# Patient Record
Sex: Male | Born: 1965
Health system: Southern US, Community
[De-identification: ages and names within clinical notes are randomized; demographics above are authoritative.]

## PROBLEM LIST (undated history)

## (undated) DIAGNOSIS — J349 Unspecified disorder of nose and nasal sinuses: Secondary | ICD-10-CM

## (undated) DIAGNOSIS — C9210 Chronic myeloid leukemia, BCR/ABL-positive, not having achieved remission: Secondary | ICD-10-CM

## (undated) DIAGNOSIS — Z973 Presence of spectacles and contact lenses: Secondary | ICD-10-CM

## (undated) DIAGNOSIS — K219 Gastro-esophageal reflux disease without esophagitis: Secondary | ICD-10-CM

## (undated) DIAGNOSIS — Z9889 Other specified postprocedural states: Secondary | ICD-10-CM

## (undated) DIAGNOSIS — F329 Major depressive disorder, single episode, unspecified: Secondary | ICD-10-CM

## (undated) DIAGNOSIS — F32A Depression, unspecified: Secondary | ICD-10-CM

## (undated) DIAGNOSIS — I201 Angina pectoris with documented spasm: Secondary | ICD-10-CM

## (undated) DIAGNOSIS — E785 Hyperlipidemia, unspecified: Secondary | ICD-10-CM

## (undated) DIAGNOSIS — Z8719 Personal history of other diseases of the digestive system: Secondary | ICD-10-CM

## (undated) DIAGNOSIS — R112 Nausea with vomiting, unspecified: Secondary | ICD-10-CM

## (undated) DIAGNOSIS — M1990 Unspecified osteoarthritis, unspecified site: Secondary | ICD-10-CM

## (undated) DIAGNOSIS — G4733 Obstructive sleep apnea (adult) (pediatric): Secondary | ICD-10-CM

## (undated) DIAGNOSIS — Z789 Other specified health status: Secondary | ICD-10-CM

## (undated) HISTORY — PX: TONSILLECTOMY: SUR1361

## (undated) HISTORY — PX: CARDIAC CATHETERIZATION: SHX172

## (undated) HISTORY — DX: Angina pectoris with documented spasm: I20.1

## (undated) HISTORY — DX: Hyperlipidemia, unspecified: E78.5

## (undated) HISTORY — DX: Obstructive sleep apnea (adult) (pediatric): G47.33

## (undated) HISTORY — PX: COLONOSCOPY: SHX174

---

## 2002-10-28 ENCOUNTER — Ambulatory Visit (HOSPITAL_COMMUNITY): Admission: RE | Admit: 2002-10-28 | Discharge: 2002-10-28 | Payer: Self-pay | Admitting: Cardiology

## 2007-09-04 HISTORY — PX: HAND / FINGER LESION EXCISION: SUR531

## 2007-12-26 ENCOUNTER — Ambulatory Visit (HOSPITAL_BASED_OUTPATIENT_CLINIC_OR_DEPARTMENT_OTHER): Admission: RE | Admit: 2007-12-26 | Discharge: 2007-12-26 | Payer: Self-pay | Admitting: Orthopedic Surgery

## 2008-10-23 ENCOUNTER — Encounter: Admission: RE | Admit: 2008-10-23 | Discharge: 2008-10-23 | Payer: Self-pay | Admitting: Family Medicine

## 2010-08-23 ENCOUNTER — Ambulatory Visit: Payer: Self-pay | Admitting: Sports Medicine

## 2010-08-23 DIAGNOSIS — M2170 Unequal limb length (acquired), unspecified site: Secondary | ICD-10-CM

## 2010-08-23 DIAGNOSIS — M25569 Pain in unspecified knee: Secondary | ICD-10-CM

## 2010-08-23 DIAGNOSIS — Q667 Congenital pes cavus, unspecified foot: Secondary | ICD-10-CM | POA: Insufficient documentation

## 2010-10-05 NOTE — Assessment & Plan Note (Signed)
Summary: NP ORTHOTICS/MJD   Vital Signs:  Patient profile:   45 year old male Height:      74 inches Weight:      209 pounds BMI:     26.93 Pulse rate:   50 / minute BP sitting:   123 / 71  (left arm)  Vitals Entered By: Rochele Pages RN (August 23, 2010 11:15 AM) CC: eval for orthotics   CC:  eval for orthotics.  History of Present Illness: runs with Thad in 2005 first had orthotics made for persistent knee pain and leg length diff now has started running again just did half marathon peaked at about 23 MPW wants to run marathon  orthotics made in 2005 by dr bassett are now broken down and he is starting to get back and knee pain issues again along with calf tightness on RT where he has lift  Preventive Screening-Counseling & Management  Alcohol-Tobacco     Smoking Status: quit  Social History: Smoking Status:  quit  Physical Exam  General:  Well-developed,well-nourished,in no acute distress; alert,appropriate and cooperative throughout examination Msk:  RT leg is 1 cm shorter than left  good ROM of hips and knees no real weaknes sof quads  tight on forward flexion and on knee to chest   feet show mod cavus change left foot though shows some midfoot pronation  running gait is good w excellent form   Impression & Recommendations:  Problem # 1:  TALIPES CAVUS (ICD-754.71)  I think his higer arched foot is giving him more impact which helps trigger both back and knee pain issues  Patient was fitted for a standard, cushioned, semi-rigid orthotic.  The orthotic was heated and the patient stood on the orthotic blank positioned on the orthotic stand. The patient was positioned in subtalar neutral position and 10 degrees of ankle dorsiflexion in a weight bearing stance. After completion of molding a stable based was applied to the orthotic blank.   The blank was ground to a stable position for weight bearing. size 12 blue swirl base large blue EVA posting rt  orthopedic foam half length additional orthotic padding  none  time 40 mins  gait looked good and very comfortable on completion  Orders: Orthotic Materials, each unit (L3002)  Problem # 2:  UNEQUAL LEG LENGTH (ICD-736.81)  only small correction added to RT  not much change on running gait and looks fine after correction made  Orders: Orthotic Materials, each unit (Z6109)  Problem # 3:  KNEE PAIN, BILATERAL (ICD-719.46) follow  this went away w new orthotics in past  see if these take pressure off knees   Orders Added: 1)  Est. Patient Level IV [60454] 2)  Orthotic Materials, each unit [L3002]

## 2011-01-16 NOTE — Op Note (Signed)
NAMEMONTAVIOUS, WIERZBA               ACCOUNT NO.:  [id number]   MEDICAL RECORD NO.:  [id number]          PATIENT TYPE:  AMB   LOCATION:  DSC                          FACILITY:  [facility]   PHYSICIAN:  Cindee Salt, M.D.       DATE OF BIRTH:  25-May-1966   DATE OF PROCEDURE:  12/26/2007  DATE OF DISCHARGE:                               OPERATIVE REPORT   PREOPERATIVE DIAGNOSIS:  Fracture of fifth metacarpal, left hand.   POSTOPERATIVE DIAGNOSIS:  Fracture of fifth metacarpal, left hand.   OPERATION:  Open reduction and internal fixation fifth metacarpal  fracture, left hand.   SURGEON:  Cindee Salt, MD   ASSISTANT:  RN.   ANESTHESIA:  Regional block by Dr. Sampson Goon.   HISTORY:  The patient is a 45 year old male who suffered a fracture of  his fifth metacarpal, left hand.  This has displaced.  He is admitted  for open reduction and internal fixation.  He is aware of risks and  complications including infection, recurrence, injury to arteries,  nerves, tendons, complete relief of symptoms, dystrophy, breakdown of  any fixation, redisplacement, nonunion, injury to arteries, nerves,  tendons, and dystrophy.  In the preoperative area, the patient is seen.  The extremity marked by both the patient and surgeon.  Antibiotic was  given.  Questions again encouraged and answered.   PROCEDURE:  The patient was brought to the operating room.  Supraclavicular block was carried out without difficulty by Dr.  Sampson Goon, prepped using DuraPrep, supine position, left arm free.  A  time-out was taken.  The limb was exsanguinated with an Esmarch bandage.  Tourniquet placed high and the arm was inflated to 250 mmHg.  A straight  incision was made over the metacarpal shaft and carried down through  subcutaneous tissue.  This was then carried to the ulnar side of the  metacarpal head and then distally carried down through the subcutaneous  tissue.  Bleeders were electrocauterized.  Dorsal sensory branch of  the  ulnar nerve was identified along with sensory nerves.  The dissection  carried to the ulnar aspect of the extensor tendons.  The periosteum was  then incised.  The tendons were then retracted radially.  This allowed  visualization of the fracture, which was at the level of the articular  surface on the ulnar aspect and then a spiral to the radial and volar  side.  This was significantly displaced with blunt and sharp dissection.  Periosteum was elevated.  The contracture released.  This allowed the  fracture fragment with the articular surface to be displaced distally.  This was then pinned with two 0.028 K-wires.  X-rays confirmed  positioning of this.  There was no angulation or rotation with the  fingers held in a fully flexed position.  Drill holes were then made  with a 1.1-mm drill bit for 1.5-mm Synthes screws.  These were inserted.  The first was 12 mm at the most proximal aspect distal 2 and then 14 mm.  The central 2 were then 212.  The more proximally 12 was longest, that  was removed  and replaced with a 10-mm screw.  The x-rays confirmed that  the positioning was adequate with one of the 11-mm distal screws.  This  did not require replacement.  X-rays confirmed that the fracture was  reduced in the AP, lateral, and oblique direction.  The finger placed  through full range of motion.  No angulation, rotation, or overlap was  noted.  Wound was irrigated.  The periosteum closed with figure-of-eight  4-0 Vicryl sutures, subcutaneous tissue with interrupted 4-0 Vicryl, and  the skin with interrupted 5-0 Vicryl Rapide  sutures.  Sterile compressive ulnar gutter splint was applied.  Deflation of tourniquet, all fingers immediately pinked and he was taken  to the recovery room for observation in satisfactory condition.  He will  be discharged home, returning to [facility] in 1 week on Percocet.           ______________________________  Cindee Salt, M.D.     GK/MEDQ  D:   12/26/2007  T:  12/27/2007  Job:  [id number]   cc:   Caryn Bee L. Little, M.D.

## 2011-01-19 NOTE — Cardiovascular Report (Signed)
   NAMEKHAM, ZUCKERMAN                           ACCOUNT NO.:  [id number]   MEDICAL RECORD NO.:  [id number]                   PATIENT TYPE:  OIB   LOCATION:  2899                                 FACILITY:  [facility]   PHYSICIAN:  Lesleigh Noe, M.D.            DATE OF BIRTH:  May 11, 1966   DATE OF PROCEDURE:  10/28/2002  DATE OF DISCHARGE:                              CARDIAC CATHETERIZATION   REASON FOR PROCEDURE:  Returned chest discomfort, abnormal Cardiolite.  The  patient was referred by Dr. Mayford Knife for diagnostic coronary angiography.   PROCEDURE PERFORMED:  1. Left heart catheterization.  2. Selective coronary angiography.  3. Left ventriculography.   DESCRIPTION OF PROCEDURE:  After informed consent, a 6-French sheath was  inserted into the right femoral artery using the modified Seldinger  technique.  A 6-French A2 multipurpose catheter was used for hemodynamic  recordings, left ventriculography, and selective left and right coronary  angiography.  The patient tolerated the procedure without complications.   RESULTS:  1. Hemodynamic data     A. Aortic pressure 127/79.     B. Left ventricular pressure 127/8.  2. Left ventriculography:  LV cavity size and function was normal.  EF was     60%.  3. Coronary angiography     A. Left main coronary:  Normal.     B. Left anterior descending coronary:  Normal.  A large diagonal arises        proximally from the LAD, and is also normal.     C. Circumflex artery:  Large and normal.     D. Right coronary:  The right coronary artery is dominant and normal.   CONCLUSION:  1. Normal coronary arteries.  2.     Normal left ventricular function.  3. False positive Cardiolite.   PLAN:  Further evaluation and management per Dr. Mayford Knife.                                                Lesleigh Noe, M.D.    HWS/MEDQ  D:  10/28/2002  T:  10/28/2002  Job:  [id number]   cc:   Armanda Magic, M.D.  [address], [address]  [place], [place] [address]  Fax: [phone]   Anna Genre. Little, M.D.  [address]  [place]  [place] [address]  Fax: [phone]

## 2011-05-29 LAB — POCT HEMOGLOBIN-HEMACUE: Hemoglobin: 14.1

## 2011-08-08 ENCOUNTER — Other Ambulatory Visit: Payer: Self-pay | Admitting: Otolaryngology

## 2011-08-08 NOTE — H&P (Signed)
  [name] [name], [name] [name] 45 y.o., male [id number]     Chief Complaint: nasal obstruction, snoring  HPI: 45 year old white male with a long history of nasal obstruction.  He has snoring but no documented sleep apnea.  He feels like his nasal obstruction is limiting his aerobic exercise capacity.  No pain or drainage.  PMH:No past medical history on file.  Surg Hx:No past surgical history on file.  FHx:  No family history on file. SocHx:  does not have a smoking history on file. He does not have any smokeless tobacco history on file. His alcohol and drug histories not on file.  ALLERGIES: No Known Allergies  No current outpatient prescriptions on file as of 08/08/2011.   No current facility-administered medications on file as of 08/08/2011.    No results found for this or any previous visit (from the past 48 hour(s)). No results found.  JYN:[id number] for facial pain or nasal drainage.  No ear infections or hearing difficulty.  No swallowing problems or sore throats.  No lymphadenopathy.  No chest pain or shortness of breath.  No abdominal pain.  No difficulty passing urine or bowel movements.  No history of blood clots.  There were no vitals taken for this visit.  PHYSICAL EXAM: Overall appearance:  tall, trim Head:atraumatic Ears:normal aerated drums. Nose:moderately severe leftward septal deviation with bilateral hypertrophic inferior turbinates Oral Cavity:  Mandible, maxilla, and tongue of normal configuration. Oral Pharynx/Hypopharynx/Larynx :  Long thick central uvula with a long thin soft palate. Midline tattoo. Neuro:  Normal and symmetric Neck:no adenopathy Lungs: Clear to auscultation Heart: Regular rate and rhythm without murmurs Abdomen: Soft, active     Assessment/Plan Nasal septal deviation with hypertrophic inferior turbinates and bilateral nasal airway obstruction.  Long soft palate and uvula with snoring but no sleep apnea.  Flo Shanks T 08/08/2011, 5:42  PM

## 2011-08-10 ENCOUNTER — Encounter (HOSPITAL_BASED_OUTPATIENT_CLINIC_OR_DEPARTMENT_OTHER): Payer: Self-pay | Admitting: *Deleted

## 2011-08-10 NOTE — Progress Notes (Signed)
Pt is runner-uses afrin prior to exercise

## 2011-08-13 ENCOUNTER — Encounter (HOSPITAL_BASED_OUTPATIENT_CLINIC_OR_DEPARTMENT_OTHER): Payer: Self-pay | Admitting: Anesthesiology

## 2011-08-13 ENCOUNTER — Ambulatory Visit (HOSPITAL_BASED_OUTPATIENT_CLINIC_OR_DEPARTMENT_OTHER)
Admission: RE | Admit: 2011-08-13 | Discharge: 2011-08-13 | Disposition: A | Discharge status: Home / Self Care | Payer: BC Managed Care – PPO | Source: Ambulatory Visit | Attending: Otolaryngology | Admitting: Otolaryngology

## 2011-08-13 ENCOUNTER — Encounter (HOSPITAL_BASED_OUTPATIENT_CLINIC_OR_DEPARTMENT_OTHER)
Admission: RE | Disposition: A | Discharge status: Home / Self Care | Payer: Self-pay | Source: Ambulatory Visit | Attending: Otolaryngology

## 2011-08-13 ENCOUNTER — Encounter (HOSPITAL_BASED_OUTPATIENT_CLINIC_OR_DEPARTMENT_OTHER): Payer: Self-pay | Admitting: *Deleted

## 2011-08-13 ENCOUNTER — Ambulatory Visit (HOSPITAL_BASED_OUTPATIENT_CLINIC_OR_DEPARTMENT_OTHER): Payer: BC Managed Care – PPO | Admitting: Anesthesiology

## 2011-08-13 DIAGNOSIS — Z01812 Encounter for preprocedural laboratory examination: Secondary | ICD-10-CM | POA: Insufficient documentation

## 2011-08-13 DIAGNOSIS — K219 Gastro-esophageal reflux disease without esophagitis: Secondary | ICD-10-CM | POA: Insufficient documentation

## 2011-08-13 DIAGNOSIS — J342 Deviated nasal septum: Secondary | ICD-10-CM | POA: Insufficient documentation

## 2011-08-13 DIAGNOSIS — J343 Hypertrophy of nasal turbinates: Secondary | ICD-10-CM | POA: Insufficient documentation

## 2011-08-13 HISTORY — DX: Unspecified osteoarthritis, unspecified site: M19.90

## 2011-08-13 HISTORY — DX: Major depressive disorder, single episode, unspecified: F32.9

## 2011-08-13 HISTORY — PX: NASAL SEPTOPLASTY W/ TURBINOPLASTY: SHX2070

## 2011-08-13 HISTORY — DX: Unspecified disorder of nose and nasal sinuses: J34.9

## 2011-08-13 HISTORY — DX: Other specified health status: Z78.9

## 2011-08-13 HISTORY — DX: Gastro-esophageal reflux disease without esophagitis: K21.9

## 2011-08-13 HISTORY — DX: Depression, unspecified: F32.A

## 2011-08-13 LAB — POCT HEMOGLOBIN-HEMACUE: Hemoglobin: 14.2 g/dL (ref 13.0–17.0)

## 2011-08-13 SURGERY — SEPTOPLASTY, NOSE, WITH NASAL TURBINATE REDUCTION
Anesthesia: General | Site: Nose | Wound class: Clean Contaminated

## 2011-08-13 MED ORDER — PROPOFOL 10 MG/ML IV EMUL
INTRAVENOUS | Status: DC | PRN
  Administered 2011-08-13: 30 mg via INTRAVENOUS
  Administered 2011-08-13: 250 mg via INTRAVENOUS

## 2011-08-13 MED ORDER — CEFAZOLIN SODIUM-DEXTROSE 2-3 GM-% IV SOLR
2.0000 g | Freq: Once | INTRAVENOUS | Status: AC
  Administered 2011-08-13: 2 g via INTRAVENOUS

## 2011-08-13 MED ORDER — OXYCODONE-ACETAMINOPHEN 5-325 MG/5ML PO SOLN: 5.0000 mL | Freq: Once | ORAL | Status: DC

## 2011-08-13 MED ORDER — ONDANSETRON HCL 4 MG/2ML IJ SOLN: 4.0000 mg | INTRAMUSCULAR | Status: DC | PRN

## 2011-08-13 MED ORDER — ONDANSETRON HCL 4 MG/2ML IJ SOLN
INTRAMUSCULAR | Status: DC | PRN
  Administered 2011-08-13: 4 mg via INTRAVENOUS

## 2011-08-13 MED ORDER — DEXAMETHASONE SODIUM PHOSPHATE 4 MG/ML IJ SOLN
INTRAMUSCULAR | Status: DC | PRN
  Administered 2011-08-13: 10 mg via INTRAVENOUS

## 2011-08-13 MED ORDER — LACTATED RINGERS IV SOLN: INTRAVENOUS | Status: DC

## 2011-08-13 MED ORDER — CEPHALEXIN 250 MG/5ML PO SUSR: 500.0000 mg | Freq: Four times a day (QID) | ORAL | Status: AC

## 2011-08-13 MED ORDER — HYDROMORPHONE HCL PF 1 MG/ML IJ SOLN: 0.2500 mg | INTRAMUSCULAR | Status: DC | PRN

## 2011-08-13 MED ORDER — ACETAMINOPHEN 10 MG/ML IV SOLN
1000.0000 mg | Freq: Once | INTRAVENOUS | Status: AC
  Administered 2011-08-13: 1000 mg via INTRAVENOUS

## 2011-08-13 MED ORDER — MORPHINE SULFATE 2 MG/ML IJ SOLN: 0.0500 mg/kg | INTRAMUSCULAR | Status: DC | PRN

## 2011-08-13 MED ORDER — ROCURONIUM BROMIDE 100 MG/10ML IV SOLN
INTRAVENOUS | Status: DC | PRN
  Administered 2011-08-13: 50 mg via INTRAVENOUS

## 2011-08-13 MED ORDER — ONDANSETRON HCL 4 MG PO TABS: 4.0000 mg | ORAL_TABLET | ORAL | Status: DC | PRN

## 2011-08-13 MED ORDER — NEOSTIGMINE METHYLSULFATE 1 MG/ML IJ SOLN
INTRAMUSCULAR | Status: DC | PRN
  Administered 2011-08-13: 2 mg via INTRAVENOUS

## 2011-08-13 MED ORDER — METOCLOPRAMIDE HCL 5 MG/ML IJ SOLN: 10.0000 mg | Freq: Once | INTRAMUSCULAR | Status: DC | PRN

## 2011-08-13 MED ORDER — LIDOCAINE HCL (CARDIAC) 20 MG/ML IV SOLN
INTRAVENOUS | Status: DC | PRN
  Administered 2011-08-13: 50 mg via INTRAVENOUS

## 2011-08-13 MED ORDER — MIDAZOLAM HCL 2 MG/2ML IJ SOLN: 0.5000 mg | INTRAMUSCULAR | Status: DC | PRN

## 2011-08-13 MED ORDER — LACTATED RINGERS IV SOLN
INTRAVENOUS | Status: DC
  Administered 2011-08-13 (×4): via INTRAVENOUS

## 2011-08-13 MED ORDER — BACITRACIN ZINC 500 UNIT/GM EX OINT: 1.0000 "application " | TOPICAL_OINTMENT | Freq: Three times a day (TID) | CUTANEOUS | Status: DC

## 2011-08-13 MED ORDER — LIDOCAINE-EPINEPHRINE 1 %-1:100000 IJ SOLN
INTRAMUSCULAR | Status: DC | PRN
  Administered 2011-08-13: 20 mL

## 2011-08-13 MED ORDER — OXYCODONE-ACETAMINOPHEN 5-325 MG/5ML PO SOLN: 5.0000 mL | ORAL | Status: AC | PRN

## 2011-08-13 MED ORDER — FENTANYL CITRATE 0.05 MG/ML IJ SOLN
INTRAMUSCULAR | Status: DC | PRN
  Administered 2011-08-13: 50 ug via INTRAVENOUS
  Administered 2011-08-13: 100 ug via INTRAVENOUS

## 2011-08-13 MED ORDER — SODIUM CHLORIDE 0.9 % IV SOLN: INTRAVENOUS | Status: DC

## 2011-08-13 MED ORDER — BACITRACIN-NEOMYCIN-POLYMYXIN 400-5-5000 EX OINT
TOPICAL_OINTMENT | CUTANEOUS | Status: DC | PRN
  Administered 2011-08-13: 1 via TOPICAL

## 2011-08-13 MED ORDER — OXYMETAZOLINE HCL 0.05 % NA SOLN
2.0000 | NASAL | Status: AC
  Administered 2011-08-13 (×2): 2 via NASAL

## 2011-08-13 MED ORDER — GLYCOPYRROLATE 0.2 MG/ML IJ SOLN
INTRAMUSCULAR | Status: DC | PRN
  Administered 2011-08-13: .2 mg via INTRAVENOUS
  Administered 2011-08-13: .4 mg via INTRAVENOUS

## 2011-08-13 MED ORDER — HYDROCODONE-ACETAMINOPHEN 7.5-500 MG/15ML PO SOLN: 10.0000 mL | ORAL | Status: DC | PRN

## 2011-08-13 MED ORDER — COCAINE HCL POWD
Status: DC | PRN
  Administered 2011-08-13: 200 mg via NASAL

## 2011-08-13 MED ORDER — DROPERIDOL 2.5 MG/ML IJ SOLN
INTRAMUSCULAR | Status: DC | PRN
  Administered 2011-08-13: 0.625 mg via INTRAVENOUS

## 2011-08-13 MED ORDER — MIDAZOLAM HCL 5 MG/5ML IJ SOLN
INTRAMUSCULAR | Status: DC | PRN
  Administered 2011-08-13: 2 mg via INTRAVENOUS

## 2011-08-13 MED ORDER — HYDROCODONE-ACETAMINOPHEN 7.5-500 MG/15ML PO SOLN: 10.0000 mL | Freq: Four times a day (QID) | ORAL | Status: AC | PRN

## 2011-08-13 MED ORDER — OXYMETAZOLINE HCL 0.05 % NA SOLN
NASAL | Status: DC | PRN
  Administered 2011-08-13: 1 via NASAL

## 2011-08-13 SURGICAL SUPPLY — 38 items
AIRWAY NASO PHAR 26FR 6.5 (TUBING)
AIRWAY NASOPHAR 26 6.5 (TUBING) IMPLANT
ATTRACTOMAT 16X20 MAGNETIC DRP (DRAPES) IMPLANT
CANISTER SUCTION 1200CC (MISCELLANEOUS) ×2 IMPLANT
CLOTH BEACON ORANGE TIMEOUT ST (SAFETY) ×2 IMPLANT
COAGULATOR SUCT 8FR VV (MISCELLANEOUS) ×2 IMPLANT
COTTONBALL LRG STERILE PKG (GAUZE/BANDAGES/DRESSINGS) ×2 IMPLANT
DECANTER SPIKE VIAL GLASS SM (MISCELLANEOUS) IMPLANT
DEPRESSOR TONGUE BLADE STERILE (MISCELLANEOUS) ×4 IMPLANT
DRSG NASOPORE 8CM (GAUZE/BANDAGES/DRESSINGS) IMPLANT
ELECT REM PT RETURN 9FT ADLT (ELECTROSURGICAL) ×2
ELECTRODE REM PT RTRN 9FT ADLT (ELECTROSURGICAL) ×1 IMPLANT
GAUZE PACKING FOLDED 2  STR (GAUZE/BANDAGES/DRESSINGS) ×1
GAUZE PACKING FOLDED 2 STR (GAUZE/BANDAGES/DRESSINGS) ×1 IMPLANT
GLOVE ECLIPSE 8.0 STRL XLNG CF (GLOVE) ×4 IMPLANT
GLOVE SKINSENSE NS SZ7.0 (GLOVE) ×1
GLOVE SKINSENSE STRL SZ7.0 (GLOVE) ×1 IMPLANT
GOWN PREVENTION PLUS XLARGE (GOWN DISPOSABLE) ×2 IMPLANT
GOWN PREVENTION PLUS XXLARGE (GOWN DISPOSABLE) ×2 IMPLANT
NEEDLE HYPO 25X1 1.5 SAFETY (NEEDLE) ×2 IMPLANT
NEEDLE SPNL 25GX3.5 QUINCKE BL (NEEDLE) ×2 IMPLANT
NS IRRIG 1000ML POUR BTL (IV SOLUTION) ×2 IMPLANT
PACK BASIN DAY SURGERY FS (CUSTOM PROCEDURE TRAY) ×2 IMPLANT
PACK ENT DAY SURGERY (CUSTOM PROCEDURE TRAY) ×2 IMPLANT
PATTIES SURGICAL .5 X3 (DISPOSABLE) ×2 IMPLANT
SLEEVE SCD COMPRESS KNEE MED (MISCELLANEOUS) ×2 IMPLANT
SPONGE GAUZE 2X2 8PLY STRL LF (GAUZE/BANDAGES/DRESSINGS) ×2 IMPLANT
SUT CHROMIC 3 0 PS 2 (SUTURE) IMPLANT
SUT CHROMIC 4 0 P 3 18 (SUTURE) ×2 IMPLANT
SUT ETHILON 3 0 PS 1 (SUTURE) ×2 IMPLANT
SUT PDS AB 4-0 P3 18 (SUTURE) ×2 IMPLANT
SUT PLAIN 4 0 ~~LOC~~ 1 (SUTURE) IMPLANT
SUT SILK 2 0 FS (SUTURE) ×2 IMPLANT
SUT VIC AB 3-0 FS2 27 (SUTURE) IMPLANT
TOWEL OR 17X24 6PK STRL BLUE (TOWEL DISPOSABLE) ×4 IMPLANT
TRAY DSU PREP LF (CUSTOM PROCEDURE TRAY) ×2 IMPLANT
WATER STERILE IRR 1000ML POUR (IV SOLUTION) IMPLANT
YANKAUER SUCT BULB TIP NO VENT (SUCTIONS) ×2 IMPLANT

## 2011-08-13 NOTE — Anesthesia Postprocedure Evaluation (Signed)
  Anesthesia Post Note  Patient: [name] [name]  Procedure(s) Performed:  NASAL SEPTOPLASTY WITH TURBINATE REDUCTION - with LAUP  Anesthesia type: General  Patient location: PACU  Post pain: Pain level controlled  Post assessment: Patient's Cardiovascular Status Stable  Last Vitals:  Filed Vitals:   08/13/11 1100  BP: 149/78  Pulse: 67  Temp: 36.4 C  Resp: 16    Post vital signs: Reviewed and stable  Level of consciousness: alert  Complications: No apparent anesthesia complications

## 2011-08-13 NOTE — Op Note (Addendum)
08/13/2011  10:27 AM    [name] [name]  [id number]   Pre-Op Dx:  Deviated Nasal Septum, Hypertrophic Inferior Turbinates, Snoring  Post-op Dx: Same  Proc: Nasal Septoplasty, Bilateral SMR Inferior Turbinates, LAUP   Surg:  Flo Shanks T MD  Anes:  GOT  EBL:  20 ml  Comp:  none  Findings:  LEFT anterior septal deviation.  Large RIGHT inferior turbinate.  Long thick uvula and mod long soft palate.  Procedure: With the patient in a comfortable supine position,  general orotracheal anesthesia was induced without difficulty.     The patient received preoperative Afrin spray for topical decongestion and vasoconstriction.  Intravenous prophylactic antibiotics were administered.  At an appropriate level, the patient was placed in a semi-sitting position.  A saline moistened throat pack was placed.  Nasal vibrissae were trimmed.  Cocaine crystals, 200 mg total,  were applied on cotton carriers to the anterior ethmoid and sphenopalatine ganglion regions on both sides. Afrin solution was applied on 0.5" x 3" cottonoids to both sides of the septal mucosa.   1% Xylocaine with 1:100,000 epinephrine, 10 cc's, was infiltrated into the anterior floor of the nose, into the nasal spine region, into the membranous columella, and finally into the submucoperichondrial plane of the septum on both sides.  Several minutes were allowed for this to take effect.  A sterile preparation and draping of the midface was accomplished in the standard fashion.  The materials were removed from the nose and observed to be intact and correct in number.  The nose was inspected with a headlight with the findings as described above.  A RIGHT hemitransfixion incision was sharply executed and carried down to the caudal edge of the quadrangular cartilage and continued to a floor incision.  An opposite small floor incision was sharply executed as well.   Floor tunnels were elevated on both sides, carried posteriorly, then  medially, then brought forward along the vomer and maxillary crest.  The submucoperichondrial plane of the  RIGHT septum was dissected up to the dorsum of the nose, back onto the perpendicular plate, and brought down and communicated with a floor tunnel and then forward along the maxillary crest.  The flap was generated intact.  The chondroethmoid junction was identified and opened with a Risk analyst.  The opposite submucoperiosteal plane of the perpendicular plate of the ethmoid  was elevated and carried down to the floor tunnel posteriorly.  The superior perpendicular plate was lysed with an open Jansen-Middleton forceps.  The inferior portion was dissected from the maxillary crest and vomer with a Cottle elevator.  The midportion was rocked free with a closed [employer] forceps and then delivered.    The posterior inferior corner of the quadrangular cartilage was submucosally resected, including a cartilaginous tail up along the vomer.     The inferior edge of the caudal strut was shortened appprox 2 mm to allow it to trap door into the midline.  The septum was separated from the upper lateral cartilages sharply.    After mobilizing the septum adequately, and straightening it in the standard fashion,  The septum was secured to the nasal spine with a figure-of-eight 4-0 PDS suture.  A good straight midline configuration of the septum with good dorsal support was generated.  The septal tunnel was suctioned clear.  Hemostasis was observed.  The flaps were laid back down.  The incisions were closed with interrupted 4-0 chromic suture.  The flaps were secured with a mattress style running  4-0 plain gut suture.  Just prior to completing the septoplasty, the inferior turbinates were each infiltrated with additional 1% Xylocaine with 1:100,000 epinephrine,  6 cc's total.  Upon completing the septoplasty, beginning on the RIGHT side, the inferior turbinate was inspected and infractured.  The  anterior hood of the inferior turbinate was sharply lysed just behind the nasal valve.  The medial mucosa of the inferior turbinate was incised in an  anterior upsloping fashion and a laterally based flap was developed from the turbinate bone.  Using angled turbinate scissors, turbinate bone and lateral mucosa were resected in a posterior downsloping fashion, taking much of the anterior pole and leaving most of the posterior pole.  Bony spicules were submucosally dissected and removed.  The mucosal flap was laid back down and the turbinate was outfractured.  This completed one SMR inferior turbinate.  The opposite side was performed in identical fashion.  The LEFT turbinate was smaller and was reduced more conservatively.  Again hemostasis was observed.  After completing both turbinate resections, 0.040" reinforced Silastic splints were fashioned, placed against the nasal septum for support, and secured thereto with a 3-0 Ethilon stitch.   Telfa packs impregnated with bacitracin ointment were placed between the septum and the inferior turbinates, one on each side, for hemostasis and support.    At this point, the patient was placed supine, the table was rotated 90 away from anesthesia, and then the patient was placed in Trendelenburg. The drapes were adjusted. Taking care to protect lips, teeth, and endotracheal tube, the Crowe Davis mouth gag was introduced, extended for visualization, and suspended from the Mayo stand in the standard fashion. The findings were as described above, including the palatal dimple point tattoo placed in the office. Saline saturated eye pads were placed on the patient in saline saturated towels were used to drape out the field. The endotracheal tube was protected under the mouth gag. The laser had been previously tested for amen function. All personnel had protective eye gear.  The uvula was retracted downward. A chevron was made just below the tattoo mark through the mucosa. The  dissection was carried down through the uvular muscle. A mucosal flap was left on the posterior surface. This was brought forward and secured to the chevron with interrupted 4-0 Vicryl sutures.  Leaving a 5 mm gap of intact mucosa, vertical incisions were made at the superior aspect of the tonsil fossa on both sides angled inward through the soft tissue of the palate. This dissection was carried up almost to the level of the tattoo but quite lateral. Hemostasis was achieved with electrocautery. The soft palate was then reapproximated laterally with a 4-0 Vicryl stitch. Hemostasis was observed. Mirror examination revealed a good oropharyngeal introitus. The pharynx was irrigated and suctioned clean. The mouth gag was relaxed for several minutes.    At this point the procedure was completed.  The pharynx was suctioned free and the throat pack was removed.   The patient was returned to anesthesia, awakened, extubated, and transferred to recovery in stable condition.  Dispo:   PACU to home  Plan: Ice, elevation, narcotic analgesia, prophylactic antibiotics for the duration of indwelling nasal foreign bodies.  We will remove the nasal packing In one day, the septal splints in 10 days.  Return to work in 10 days, strenuous activities in two weeks.  Cephus Richer MD

## 2011-08-13 NOTE — H&P (View-Only) (Signed)
  [name],  [name] 45 y.o., male [id number]     Chief Complaint: nasal obstruction, snoring  HPI: 45-year-old white male with a long history of nasal obstruction.  He has snoring but no documented sleep apnea.  He feels like his nasal obstruction is limiting his aerobic exercise capacity.  No pain or drainage.  PMH:No past medical history on file.  Surg Hx:No past surgical history on file.  FHx:  No family history on file. SocHx:  does not have a smoking history on file. He does not have any smokeless tobacco history on file. His alcohol and drug histories not on file.  ALLERGIES: No Known Allergies  No current outpatient prescriptions on file as of 08/08/2011.   No current facility-administered medications on file as of 08/08/2011.    No results found for this or any previous visit (from the past 48 hour(s)). No results found.  ROS:negative for facial pain or nasal drainage.  No ear infections or hearing difficulty.  No swallowing problems or sore throats.  No lymphadenopathy.  No chest pain or shortness of breath.  No abdominal pain.  No difficulty passing urine or bowel movements.  No history of blood clots.  There were no vitals taken for this visit.  PHYSICAL EXAM: Overall appearance:  tall, trim Head:atraumatic Ears:normal aerated drums. Nose:moderately severe leftward septal deviation with bilateral hypertrophic inferior turbinates Oral Cavity:  Mandible, maxilla, and tongue of normal configuration. Oral Pharynx/Hypopharynx/Larynx :  Long thick central uvula with a long thin soft palate. Midline tattoo. Neuro:  Normal and symmetric Neck:no adenopathy Lungs: Clear to auscultation Heart: Regular rate and rhythm without murmurs Abdomen: Soft, active     Assessment/Plan Nasal septal deviation with hypertrophic inferior turbinates and bilateral nasal airway obstruction.  Long soft palate and uvula with snoring but no sleep apnea.  [name] [name] T 08/08/2011, 5:42  PM      

## 2011-08-13 NOTE — H&P (Signed)
History and Physical Interval Note:  08/13/2011 7:50 AM  [name] [name]  has presented today for surgery, with the diagnosis of deviated nasal septum, hypertrophic inferior turbinates  The various methods of treatment have been discussed with the patient and family. After consideration of risks, benefits and other options for treatment, the patient has consented to  Procedure(s): NASAL SEPTOPLASTY WITH TURBINATE REDUCTION as a surgical intervention .  The patients' history has been reviewed, patient examined, no change in status, stable for surgery.  I have reviewed the patients' chart and labs.  Questions were answered to the patient's satisfaction.     Cephus Richer

## 2011-08-13 NOTE — Anesthesia Preprocedure Evaluation (Signed)
Anesthesia Evaluation  Patient identified by MRN, date of birth, ID band Patient awake    Reviewed: Allergy & Precautions, H&P , NPO status , Patient's Chart, lab work & pertinent test results, reviewed documented beta blocker date and time   Airway Mallampati: II TM Distance: >3 FB Neck ROM: full    Dental   Pulmonary neg pulmonary ROS,          Cardiovascular neg cardio ROS     Neuro/Psych PSYCHIATRIC DISORDERS Negative Neurological ROS     GI/Hepatic Neg liver ROS, GERD-  Medicated,  Endo/Other  Negative Endocrine ROS  Renal/GU negative Renal ROS  Genitourinary negative   Musculoskeletal   Abdominal   Peds  Hematology negative hematology ROS (+)   Anesthesia Other Findings See surgeon's H&P   Reproductive/Obstetrics negative OB ROS                           Anesthesia Physical Anesthesia Plan  ASA: II  Anesthesia Plan: General   Post-op Pain Management:    Induction: Intravenous  Airway Management Planned: Oral ETT  Additional Equipment:   Intra-op Plan:   Post-operative Plan: Extubation in OR  Informed Consent: I have reviewed the patients History and Physical, chart, labs and discussed the procedure including the risks, benefits and alternatives for the proposed anesthesia with the patient or authorized representative who has indicated his/her understanding and acceptance.     Plan Discussed with: CRNA and Surgeon  Anesthesia Plan Comments:         Anesthesia Quick Evaluation

## 2011-08-13 NOTE — Anesthesia Procedure Notes (Addendum)
Procedure Name: Intubation Date/Time: 08/13/2011 8:08 AM Performed by: Zenia Resides D Pre-anesthesia Checklist: Patient identified, Emergency Drugs available, Suction available and Patient being monitored Patient Re-evaluated:Patient Re-evaluated prior to inductionOxygen Delivery Method: Circle System Utilized Preoxygenation: Pre-oxygenation with 100% oxygen Intubation Type: IV induction Ventilation: Mask ventilation without difficulty Laryngoscope Size: Mac and 3 Grade View: Grade II Tube type: Oral Tube size: 8.0 mm Number of attempts: 1 Airway Equipment and Method: stylet and oral airway Placement Confirmation: ETT inserted through vocal cords under direct vision,  positive ETCO2 and breath sounds checked- equal and bilateral Secured at: 23 cm Tube secured with: Tape Dental Injury: Teeth and Oropharynx as per pre-operative assessment

## 2011-08-13 NOTE — Transfer of Care (Signed)
Immediate Anesthesia Transfer of Care Note  Patient: [name] [name]  Procedure(s) Performed:  NASAL SEPTOPLASTY WITH TURBINATE REDUCTION - with LAUP  Patient Location: PACU  Anesthesia Type: General  Level of Consciousness: awake, alert  and oriented  Airway & Oxygen Therapy: Patient Spontanous Breathing and Patient connected to face mask oxygen  Post-op Assessment: Report given to PACU RN and Post -op Vital signs reviewed and stable  Post vital signs: Reviewed and stable  Complications: No apparent anesthesia complications

## 2011-08-13 NOTE — Interval H&P Note (Signed)
History and Physical Interval Note:  08/13/2011 7:51 AM  [name]  has presented today for surgery, with the diagnosis of deviated nasal septum, hypertrophic inferior turbinates  The various methods of treatment have been discussed with the patient and family. After consideration of risks, benefits and other options for treatment, the patient has consented to  Procedure(s): NASAL SEPTOPLASTY WITH TURBINATE REDUCTION, LAUP as a surgical intervention .  The patients' history has been re- reviewed, patient re-examined, no change in status, stable for surgery.  I have reviewed the patients' chart and labs.  Questions were answered to the patient's satisfaction.     [name] [name]

## 2011-08-16 ENCOUNTER — Encounter (HOSPITAL_BASED_OUTPATIENT_CLINIC_OR_DEPARTMENT_OTHER): Payer: Self-pay | Admitting: Otolaryngology

## 2013-11-24 ENCOUNTER — Encounter: Payer: Self-pay | Admitting: Cardiology

## 2013-12-09 ENCOUNTER — Encounter: Payer: Self-pay | Admitting: Interventional Cardiology

## 2013-12-15 ENCOUNTER — Ambulatory Visit (INDEPENDENT_AMBULATORY_CARE_PROVIDER_SITE_OTHER): Payer: BC Managed Care – PPO | Admitting: Cardiology

## 2013-12-15 ENCOUNTER — Encounter: Payer: Self-pay | Admitting: Cardiology

## 2013-12-15 VITALS — BP 113/70 | HR 65 | Ht 74.0 in | Wt 202.0 lb

## 2013-12-15 DIAGNOSIS — E785 Hyperlipidemia, unspecified: Secondary | ICD-10-CM | POA: Insufficient documentation

## 2013-12-15 DIAGNOSIS — I201 Angina pectoris with documented spasm: Secondary | ICD-10-CM

## 2013-12-15 DIAGNOSIS — G4733 Obstructive sleep apnea (adult) (pediatric): Secondary | ICD-10-CM

## 2013-12-15 NOTE — Patient Instructions (Signed)
Your physician recommends that you continue on your current medications as directed. Please refer to the Current Medication list given to you today.  Your physician has requested that you have an exercise tolerance test. For further information please visit [email or web address]. Please also follow instruction sheet, as given.   Your physician recommends that you return for lab work within the next two weeks for fastin LIPID and ALT Panels. Please schedule this with check out today.  Your physician wants you to follow-up in: 1 year with Dr Mallie Snooks will receive a reminder letter in the mail two months in advance. If you don't receive a letter, please call our office to schedule the follow-up appointment.

## 2013-12-15 NOTE — Progress Notes (Signed)
  [place], [name] [name],   [address] Phone: [phone] Fax:  [phone]  Date:  12/15/2013   ID:  [name] [name], DOB 20-Dec-1965, MRN [id number]  PCP:  No primary provider on file.  Cardiologist:  Fransico Him, MD     History of Present Illness: [name] [name] is a 48 y.o. male with a history of remote MI secondary to coronary artery vasospasm, dyslipidemia and mild OSA not on CPAP who presents today for followup.  He is doing well.  He denies any  SOB, DOE, LE edema, dizziness, palpitations or syncope.  He says that a few weeks ago he had a cyst under his right arm after a shot and put him on antibiotics.  After taking the antibiotics he felt like he was having some spasms in his right to mid chest region that he has a hard time describing.  After he stopped the antibiotics it resolved.  It would not occur with exertion and he did not feel it when he exercised.  He has not been exercising for a while and is deconditioned.    Wt Readings from Last 3 Encounters:  12/15/13 202 lb (91.627 kg)  08/10/11 185 lb (83.915 kg)  08/10/11 185 lb (83.915 kg)     Past Medical History  Diagnosis Date  . Sinus disorder   . Arthritis   . No pertinent past medical history   . Depression   . GERD (gastroesophageal reflux disease)   . Hyperlipidemia   . OSA (obstructive sleep apnea)     mild so patient did not want to be on CPAP  . Coronary artery vasospasm     History of MI secondary to coronary artery vasospasm    Current Outpatient Prescriptions  Medication Sig Dispense Refill  . LESCOL 40 MG capsule       . omeprazole (PRILOSEC) 20 MG capsule Take 20 mg by mouth daily.         No current facility-administered medications for this visit.    Allergies:   No Known Allergies  Social History:  The patient  reports that he has quit smoking. He does not have any smokeless tobacco history on file. He reports that he drinks alcohol. He reports that he does not use illicit  drugs.   Family History:  The patient's family history is not on file.   ROS:  Please see the history of present illness.      All other systems reviewed and negative.   PHYSICAL EXAM: VS:  Ht 6\' 2"  (1.88 m)  Wt 202 lb (91.627 kg)  BMI 25.92 kg/m2 Well nourished, well developed, in no acute distress HEENT: normal Neck: no JVD Cardiac:  normal S1, S2; RRR; no murmur Lungs:  clear to auscultation bilaterally, no wheezing, rhonchi or rales Abd: soft, nontender, no hepatomegaly Ext: no edema Skin: warm and dry Neuro:  CNs 2-12 intact, no focal abnormalities noted     ASSESSMENT AND PLAN:  1. Remote MI secondary to coronary artery vasospasm with no angina but a strange sensation in his chest that he could not describe while taking antibiotics and has sense stopped. - continue ASA 81mg  daily - ETT to rule out ischemia 2. Dyslipidemia - continue Lescol - check fasting lipid panel and ALT 3. Mild OSA not on CPAP  Followup with me in 1 year  Signed, Fransico Him, MD 12/15/2013 4:01 PM

## 2014-01-21 ENCOUNTER — Other Ambulatory Visit (INDEPENDENT_AMBULATORY_CARE_PROVIDER_SITE_OTHER): Payer: BC Managed Care – PPO

## 2014-01-21 ENCOUNTER — Ambulatory Visit (INDEPENDENT_AMBULATORY_CARE_PROVIDER_SITE_OTHER): Payer: BC Managed Care – PPO | Admitting: Physician Assistant

## 2014-01-21 DIAGNOSIS — E785 Hyperlipidemia, unspecified: Secondary | ICD-10-CM

## 2014-01-21 DIAGNOSIS — I201 Angina pectoris with documented spasm: Secondary | ICD-10-CM

## 2014-01-21 LAB — LIPID PANEL
CHOL/HDL RATIO: 3
CHOLESTEROL: 144 mg/dL (ref 0–200)
HDL: 54.3 mg/dL (ref >39.00)
LDL CALC: 81 mg/dL (ref 0–99)
TRIGLYCERIDES: 45 mg/dL (ref 0.0–149.0)
VLDL: 9 mg/dL (ref 0.0–40.0)

## 2014-01-21 LAB — ALT: ALT: 18 U/L (ref 0–53)

## 2014-01-21 NOTE — Progress Notes (Signed)
Exercise Treadmill Test  Pre-Exercise Testing Evaluation Rhythm: sinus bradycardia  Rate: 56 bpm     Test  Exercise Tolerance Test Ordering MD: Fransico Him, MD  Interpreting MD: Richardson Dopp, PA-C  Unique Test No: 1  Treadmill:  1  Indication for ETT: chest pain - rule out ischemia  Contraindication to ETT: No   Stress Modality: exercise - treadmill  Cardiac Imaging Performed: non   Protocol: standard Bruce - maximal  Max BP:  176/57  Max MPHR (bpm):  170 85% MPR (bpm):  145  MPHR obtained (bpm):  157 % MPHR obtained:  92  Reached 85% MPHR (min:sec):  12:41 Total Exercise Time (min-sec):  14:00  Workload in METS:  16.9 Borg Scale: 17  Reason ETT Terminated:  patient's desire to stop    ST Segment Analysis At Rest: normal ST segments - no evidence of significant ST depression With Exercise: no evidence of significant ST depression  Other Information Arrhythmia:  No Angina during ETT:  absent (0) Quality of ETT:  diagnostic  ETT Interpretation:  normal - no evidence of ischemia by ST analysis  Comments: Excellent exercise capacity. No chest pain. Normal BP response to exercise. No ST changes to suggest ischemia.   Recommendations: F/u with Dr. Fransico Him as directed. Signed,  Richardson Dopp, PA-C   01/21/2014 9:42 AM

## 2014-02-01 ENCOUNTER — Telehealth: Payer: Self-pay | Admitting: Cardiology

## 2014-02-01 DIAGNOSIS — E785 Hyperlipidemia, unspecified: Secondary | ICD-10-CM

## 2014-02-01 MED ORDER — FLUVASTATIN SODIUM 40 MG PO CAPS: 80.0000 mg | ORAL_CAPSULE | Freq: Every day | ORAL | Status: DC

## 2014-02-01 NOTE — Telephone Encounter (Signed)
Message copied by Alcario Drought on Mon Feb 01, 2014 11:43 AM ------      Message from: SMART, Maralyn Sago      Created: Fri Jan 29, 2014  4:13 PM       Then needs to increase Lescol to 80 mg daily.  atorvatatin is quite inexpensive, so if lescol 80 mg doesn't get LDL down adequately,may need to change to atorvastatin at that time.  Recheck lipid panel and hepatic panel in 3 months. ------

## 2014-02-01 NOTE — Telephone Encounter (Signed)
Labs scheduled and meds updated.

## 2014-05-04 ENCOUNTER — Other Ambulatory Visit: Payer: BC Managed Care – PPO

## 2014-06-16 ENCOUNTER — Ambulatory Visit (INDEPENDENT_AMBULATORY_CARE_PROVIDER_SITE_OTHER): Payer: BC Managed Care – PPO | Admitting: Podiatrist

## 2014-06-16 ENCOUNTER — Ambulatory Visit (INDEPENDENT_AMBULATORY_CARE_PROVIDER_SITE_OTHER): Payer: BC Managed Care – PPO

## 2014-06-16 ENCOUNTER — Encounter: Payer: Self-pay | Admitting: Podiatrist

## 2014-06-16 VITALS — BP 106/61 | HR 55 | Resp 16 | Ht 74.0 in | Wt 205.0 lb

## 2014-06-16 DIAGNOSIS — G5761 Lesion of plantar nerve, right lower limb: Secondary | ICD-10-CM

## 2014-06-16 DIAGNOSIS — G5781 Other specified mononeuropathies of right lower limb: Secondary | ICD-10-CM

## 2014-06-16 DIAGNOSIS — M779 Enthesopathy, unspecified: Secondary | ICD-10-CM

## 2014-06-16 NOTE — Progress Notes (Signed)
   Subjective:    Patient ID: [name] [name], male    DOB: 09-Oct-1965, 48 y.o.   MRN: [id number]  HPI Comments: "I have some pain in the bottom of my foot"  Patient c/o aching plantar forefoot right for 1 year. He is an active runner. No swelling. He has seen Dr. Layne Benton and she xrayed and thought stress fracture. No signs on xray. Rested for 6 weeks and gave a shot. Still no better.  Foot Pain Associated symptoms include chest pain.      Review of Systems  Cardiovascular: Positive for chest pain.  All other systems reviewed and are negative.      Objective:   Physical Exam  Patient is awake, alert, and oriented x 3.  In no acute distress.  Vascular status is intact with palpable pedal pulses at 2/4 DP and PT bilateral and capillary refill time within normal limits. Neurological sensation is also intact bilaterally via Semmes Weinstein monofilament at 5/5 sites. Light touch, vibratory sensation, Achilles tendon reflex is intact. Dermatological exam reveals skin color, turger and texture as normal. No open lesions present.  Musculature intact with dorsiflexion, plantarflexion, inversion, eversion.  Pain between the second and third metatarsals of the right foot is noted consistent with a neuroma. A palpable click is identified and pain with direct pressure between the metatarsal head is noted. X-rays are negative for fracture. No bony abnormalities are seen on x-ray    Assessment & Plan:  Neuroma right foot  Plan: Recommended trying another corticosteroid injection as a diagnostic and therapeutic tool. The patient did agree and this was carried out under sterile technique without complication. Discussed the possibility of alcohol sclerosing injections should this be beneficial and should the pain returned. Discussed the possibility of an orthotic to offload the area of strike when running we'll consider at the next visit if necessary.

## 2014-06-16 NOTE — Patient Instructions (Signed)
Morton's Neuroma in Sports  (Interdigital Plantar Neuroma) Morton's neuroma is a condition of the nervous system that results in pain or loss of feeling in the toes. The disease is caused by the bones of the foot squeezing the nerve that runs between two toes (interdigital nerve). The third and fourth toes are most likely to be affected by this disease. SYMPTOMS   Tingling, numbness, burning, or electric shocks in the front of the foot, often involving the third and fourth toes, although it may involve any other pair of toes.  Pain and tenderness in the front of the foot, that gets worse when walking.  Pain that gets worse when pressure is applied to the foot (wearing shoes).  Severe pain in the front of the foot, when standing on the front of the foot (on tiptoes), such as with running, jumping, pivoting, or dancing. CAUSES  Morton's neuroma is caused by swelling of the nerve between two toes. This swelling causes the nerve to be pinched between the bones of the foot. RISK INCREASES WITH:  Recurring foot or ankle injuries.  Poor fitting or worn shoes, with minimal padding and shock absorbers.  Loose ligaments of the foot, causing thickening of the nerve.  Poor foot strength and flexibility. PREVENTION  Warm up and stretch properly before activity.  Maintain physical fitness:  Foot and ankle flexibility.  Muscle strength and endurance.  Cardiovascular fitness.  Wear properly fitted and padded shoes.  Wear arch supports (orthotics), when needed. PROGNOSIS  If treated properly, Morton's neuroma can usually be cured with non-surgical treatment. For certain cases, surgery may be needed. RELATED COMPLICATIONS  Permanent numbness and pain in the foot.  Inability to participate in athletics, because of pain. TREATMENT Treatment first involves stopping any activities that make the symptoms worse. The use of ice and medicine will help reduce pain and inflammation. Wearing shoes  with a wide toe box, and an orthotic arch support or metatarsal bar, may also reduce pain. Your caregiver may give you a corticosteroid injection, to further reduce inflammation. If non-surgical treatment is unsuccessful, surgery may be needed. Surgery to fix Morton's neuroma is often performed as an outpatient procedure, meaning you can go home the same day as the surgery. The procedure involves removing the source of pressure on the nerve. If it is necessary to remove the nerve, you can expect persistent numbness. MEDICATION  If pain medicine is needed, nonsteroidal anti-inflammatory medicines (aspirin and ibuprofen), or other minor pain relievers (acetaminophen), are often advised.  Do not take pain medicine for 7 days before surgery.  Prescription pain relievers are usually prescribed only after surgery. Use only as directed and only as much as you need.  Corticosteroid injections are used in extreme cases, to reduce inflammation. These injections should be done only if necessary, because they may be given only a limited number of times. HEAT AND COLD  Cold treatment (icing) should be applied for 10 to 15 minutes every 2 to 3 hours for inflammation and pain, and immediately after activity that aggravates your symptoms. Use ice packs or an ice massage.  Heat treatment may be used before performing stretching and strengthening activities prescribed by your caregiver, physical therapist, or athletic trainer. Use a heat pack or a warm water soak. SEEK MEDICAL CARE IF:   Symptoms get worse or do not improve in 2 weeks, despite treatment.  After surgery you develop increasing pain, swelling, redness, increased warmth, bleeding, drainage of fluids, or fever.  New, unexplained symptoms develop. (  Drugs used in treatment may produce side effects.) Document Released: 06/27/2005 Document Revised: 11/12/2011 Document Reviewed: 12/02/2008 ExitCare Patient Information 2015 [employer]. This  information is not intended to replace advice given to you by your health care provider. Make sure you discuss any questions you have with your health care provider.  

## 2014-09-30 ENCOUNTER — Encounter: Payer: Self-pay | Admitting: Cardiology

## 2014-12-15 ENCOUNTER — Ambulatory Visit (INDEPENDENT_AMBULATORY_CARE_PROVIDER_SITE_OTHER): Payer: BLUE CROSS/BLUE SHIELD | Admitting: Podiatrist

## 2014-12-15 VITALS — BP 144/80 | HR 54 | Resp 11

## 2014-12-15 DIAGNOSIS — G5761 Lesion of plantar nerve, right lower limb: Secondary | ICD-10-CM

## 2014-12-15 DIAGNOSIS — G5781 Other specified mononeuropathies of right lower limb: Secondary | ICD-10-CM

## 2014-12-28 NOTE — Progress Notes (Signed)
   Subjective:    Patient ID: [name] [name], male    DOB: 03-08-1966, 49 y.o.   MRN: [id number]    Chief Complaint  Patient presents with  . Neuroma    "I started having the same pain in the same area on Friday." right 2nd MPJ area     Patient states that the injection I gave him in the past was beneficila on the right foot. The pain has just started to return. He is running more and states he noticed the discomfort however the discomfort seems to have subsided some with the running.        Objective:   Physical Exam  Patient is awake, alert, and oriented x 3.  In no acute distress.  Vascular status is intact with palpable pedal pulses at 2/4 DP and PT bilateral and capillary refill time within normal limits. Neurological sensation is also intact bilaterally via Semmes Weinstein monofilament at 5/5 sites. Light touch, vibratory sensation, Achilles tendon reflex is intact. Dermatological exam reveals skin color, turger and texture as normal. No open lesions present.  Musculature intact with dorsiflexion, plantarflexion, inversion, eversion.  Pain between the second and third metatarsals of the right foot is noted consistent with a neuroma. No palpable click is identified and pain with direct pressure between the metatarsal head is noted.     Assessment & Plan:  Neuroma right foot  Plan: Recommended trying another corticosteroid injection.  The patient did agree and this was carried out under sterile technique without complication. Discussed the possibility of alcohol sclerosing injections should this be beneficial and should the pain returned. Discussed the possibility of an orthotic to offload the area of strike when running we'll consider at the next visit if necessary.

## 2015-08-25 ENCOUNTER — Ambulatory Visit (HOSPITAL_BASED_OUTPATIENT_CLINIC_OR_DEPARTMENT_OTHER): Payer: BLUE CROSS/BLUE SHIELD | Admitting: Oncology

## 2015-08-25 ENCOUNTER — Other Ambulatory Visit: Payer: Self-pay | Admitting: Oncology

## 2015-08-25 ENCOUNTER — Ambulatory Visit (HOSPITAL_BASED_OUTPATIENT_CLINIC_OR_DEPARTMENT_OTHER): Payer: BLUE CROSS/BLUE SHIELD

## 2015-08-25 VITALS — BP 135/76 | HR 82 | Temp 98.2°F | Resp 18 | Ht 74.0 in | Wt 206.2 lb

## 2015-08-25 DIAGNOSIS — M25561 Pain in right knee: Secondary | ICD-10-CM

## 2015-08-25 DIAGNOSIS — M25562 Pain in left knee: Principal | ICD-10-CM

## 2015-08-25 DIAGNOSIS — C9210 Chronic myeloid leukemia, BCR/ABL-positive, not having achieved remission: Secondary | ICD-10-CM

## 2015-08-25 LAB — MANUAL DIFFERENTIAL
ALC: 3.9 10*3/uL — AB (ref 0.9–3.3)
ANC (CHCC manual diff): 64.3 10*3/uL — ABNORMAL HIGH (ref 1.5–6.5)
BASOPHIL: 4 % — AB (ref 0–2)
EOS%: 3 % (ref 0–7)
LYMPH: 5 % — AB (ref 14–49)
MONO: 4 % (ref 0–14)
Metamyelocytes: 18 % — ABNORMAL HIGH (ref 0–0)
Myelocytes: 12 % — ABNORMAL HIGH (ref 0–0)
PLT EST: ADEQUATE
PROMYELO: 2 % — ABNORMAL HIGH (ref 0–0)
RBC COMMENTS: NORMAL
SEG: 52 % (ref 38–77)

## 2015-08-25 LAB — CBC WITH DIFFERENTIAL/PLATELET
HCT: 44.5 % (ref 38.4–49.9)
HEMOGLOBIN: 13.8 g/dL (ref 13.0–17.1)
MCH: 27 pg — AB (ref 27.2–33.4)
MCHC: 31.2 g/dL — ABNORMAL LOW (ref 32.0–36.0)
MCV: 86.7 fL (ref 79.3–98.0)
Platelets: 224 10*3/uL (ref 140–400)
RBC: 5.13 10*6/uL (ref 4.20–5.82)
RDW: 16.3 % — AB (ref 11.0–14.6)
WBC: 78.4 10*3/uL (ref 4.0–10.3)

## 2015-08-25 LAB — CHCC SMEAR

## 2015-08-25 MED ORDER — HYDROXYUREA 500 MG PO CAPS: 500.0000 mg | ORAL_CAPSULE | Freq: Every day | ORAL | Status: DC

## 2015-08-25 MED ORDER — ALLOPURINOL 300 MG PO TABS: 300.0000 mg | ORAL_TABLET | Freq: Every day | ORAL | Status: DC

## 2015-08-25 NOTE — Progress Notes (Signed)
[facility]  Telephone:([phone] [phone]     ID: WAH SABIC DOB: [birth year]  MR#: [id number]  PYK#:[id number]  Patient Care Team: Hulan Fess, [name], [name], [name], [name], MD as Consulting Physician (Gastroenterology) PCP: Gennette Pac, MD OTHER MD:  CHIEF COMPLAINT:  leukocytosis  CURRENT TREATMENT:  Hydrea, allopurinol   HISTORY OF PRESENT ILLNESS: [name] [name] (the final "e" is pronounced) had a complete blood count as part of his annual physical 10/15/2014 and this showed a white cell count of 7.3, hemoglobin 13.9, and platelets under 96,000. The differential was unremarkable.    more recently, as preop testing for anticipated back surgery he had a repeat CBC yesterday, 08/24/2015. This showed a white cell count of 72.9 thousand.  Hemoglobin and platelet count as well as creatinine were reported as normal. No differential was done. The patient was referred to Dr. Rex [name] who today repeated the CBC, finding a white cell count of 78.2 thousand, hemoglobin 14.2, and platelets 243,000. He requested we evaluate the patient urgently.    [name] [name]'s subsequent history is as detailed below   INTERVAL HISTORY: [name] [name]  Was evaluated in the hematology clinic 08/25/2015 accompanied by his wife [name] [name]  REVIEW OF SYSTEMS:  aside from the problem with this back disc, there  Have been no specific symptoms to suggest this diagnosis. In particular there has been no fever, drenching sweats , weight loss or malaise. The patient denies unusual headaches, visual changes, nausea, vomiting, stiff neck, dizziness, or gait imbalance. There has been no cough, phlegm production, or pleurisy, no chest pain or pressure, and no change in bowel or bladder habits. The patient denies rash,  bleeding, or unexplained fatigue. A detailed review of systems was otherwise entirely negative.  PAST MEDICAL HISTORY: Past Medical History  Diagnosis Date  . Sinus disorder   . Arthritis   . No pertinent past medical history   . Depression   . GERD (gastroesophageal reflux disease)   . Hyperlipidemia   . OSA (obstructive sleep apnea)     mild so patient did not want to be on CPAP  . Coronary artery vasospasm     History of MI secondary to coronary artery vasospasm    PAST SURGICAL HISTORY: Past Surgical History  Procedure Laterality Date  . Hand / finger lesion excision  2009    fx lt metacarpal  . Nasal septoplasty w/ turbinoplasty  08/13/2011    Procedure: NASAL SEPTOPLASTY WITH TURBINATE REDUCTION;  Surgeon: Tyson Alias;  Location: [facility];  Service: ENT;  Laterality: N/A;  with LAUP  . Cardiac catheterization  99,04    both normal, chest pain felt secondary to coronary vasospasm    FAMILY HISTORY No family history on file.  the patient's father is living, age 16. He had 2 sisters, both with breast cancer diagnosed in their 26s and 41s. The patient's mother died from breast cancer at the age of 6 , it was diagnosed age 53. The patient has one brother and one sister, both in good health. There is no history of hematologic malignancies in the family.  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  He has a child from a first marriage, [name] [name], who lives in [place] is planning to become a Secretary/administrator. He has  been married 12 years to [name] [name] , and they have a daughter, [name] [name] , 2, at home. The patient attends the [employer] in [place]:     HEALTH MAINTENANCE: Social History  Substance Use Topics  . Smoking status: Former Research scientist (life sciences)  . Smokeless tobacco: Not on file  . Alcohol Use: Yes     Colonoscopy: 2010/ Amedeo Plenty  PSA:  Bone density:  Lipid panel:  No Known  Allergies  Current Outpatient Prescriptions  Medication Sig Dispense Refill  . allopurinol (ZYLOPRIM) 300 MG tablet Take 1 tablet (300 mg total) by mouth daily. 90 tablet 3  . aspirin 81 MG tablet Take 81 mg by mouth daily.    . fluvastatin (LESCOL) 40 MG capsule Take 2 capsules (80 mg total) by mouth at bedtime. 60 capsule 6  . hydroxyurea (HYDREA) 500 MG capsule Take 1 capsule (500 mg total) by mouth daily. May take with food to minimize GI side effects. 60 capsule 2  . omeprazole (PRILOSEC) 20 MG capsule Take 20 mg by mouth daily.       No current facility-administered medications for this visit.    OBJECTIVE:  Middle-aged white man who appears in good health Filed Vitals:   08/25/15 1626  BP: 135/76  Pulse: 82  Temp: 98.2 F (36.8 C)  Resp: 18     Body mass index is 26.46 kg/(m^2).    ECOG FS:1 - Symptomatic but completely ambulatory  Ocular: Sclerae unicteric, pupils equal, round and reactive to light Ear-nose-throat: Oropharynx clear and moist Lymphatic: No cervical or supraclavicular adenopathy Lungs no rales or rhonchi, good excursion bilaterally Heart regular rate and rhythm, no murmur appreciated Abd soft, nontender, positive bowel sounds , no palpable splenomegaly MSK no focal spinal tenderness, no joint edema Neuro: non-focal, well-oriented, appropriate affect    LAB RESULTS:  CMP     Component Value Date/Time   ALT 18 01/21/2014 0848    INo results found for: SPEP, UPEP  Lab Results  Component Value Date   WBC 78.4* 08/25/2015   HGB 13.8 08/25/2015   HCT 44.5 08/25/2015   MCV 86.7 08/25/2015   PLT 224 Large platelets present 08/25/2015      Chemistry   No results found for: NA, K, CL, CO2, BUN, CREATININE, GLU    Component Value Date/Time   ALT 18 01/21/2014 0848       No results found for: LABCA2  No components found for: HRCBU384  No results for input(s): INR in the last 168 hours.  Urinalysis No results found for: COLORURINE,  APPEARANCEUR, LABSPEC, PHURINE, GLUCOSEU, HGBUR, BILIRUBINUR, KETONESUR, PROTEINUR, UROBILINOGEN, NITRITE, LEUKOCYTESUR  STUDIES: No results found.  ASSESSMENT: 49 y.o. [name] man presenting with a white cell count of greater than 70,000, primarily myeloid cells, in the absence of any symptoms or signs of infection, with no splenomegaly by exam, and with no blasts seen on the peripheral smear, normal hemoglobin and platelet count  PLAN:  I spent approximately 45 minutes with the patient and his wife going over his situation. He had a normal white cell count in February of 2016. This has gone up by factor of 10. Review of the blood film is consistent with CML , and importantly we do not see any blasts in the peripheral blood. It's also  significant that his platelet count and hemoglobin are in the normal range.    We discussed the pathophysiology of chronic myeloid leukemia , as well as prognosis and treatment issues. He  will return tomorrow for additional labs , to include a BCR: ABL probe and then next week for a bone marrow biopsy which will serve as baseline.  He will see me again in January 5th and hopefully by that time we will have the probe results confirming the diagnosis and should be able to start him on tyrosine kinase inhibitors.    in the interim I am starting him on Hydrea 500 mg daily as well as allopurinol 300 mg daily. He will have a repeat blood count 08/31/2015 just to make sure we are "keeping the lid on" and of course we will repeat that when he returns to see me January 5.   he knows to call for any problems with fever or bleeding for any other issues of concern.   Chauncey Cruel, MD   08/25/2015 5:39 PM Medical Oncology and Hematology [facility] [address] [place], [place] [address] Tel. [phone]    Fax. [phone]

## 2015-08-26 ENCOUNTER — Telehealth: Payer: Self-pay | Admitting: Oncology

## 2015-08-26 ENCOUNTER — Ambulatory Visit (HOSPITAL_BASED_OUTPATIENT_CLINIC_OR_DEPARTMENT_OTHER): Payer: BLUE CROSS/BLUE SHIELD

## 2015-08-26 ENCOUNTER — Other Ambulatory Visit: Payer: Self-pay

## 2015-08-26 DIAGNOSIS — M25561 Pain in right knee: Secondary | ICD-10-CM

## 2015-08-26 DIAGNOSIS — M25562 Pain in left knee: Principal | ICD-10-CM

## 2015-08-26 DIAGNOSIS — C9210 Chronic myeloid leukemia, BCR/ABL-positive, not having achieved remission: Secondary | ICD-10-CM

## 2015-08-26 LAB — COMPREHENSIVE METABOLIC PANEL
ALBUMIN: 4.2 g/dL (ref 3.5–5.0)
ALK PHOS: 70 U/L (ref 40–150)
ALT: 32 U/L (ref 0–55)
ANION GAP: 11 meq/L (ref 3–11)
AST: 42 U/L — ABNORMAL HIGH (ref 5–34)
BUN: 21.4 mg/dL (ref 7.0–26.0)
CALCIUM: 9.9 mg/dL (ref 8.4–10.4)
CO2: 26 mEq/L (ref 22–29)
Chloride: 102 mEq/L (ref 98–109)
Creatinine: 1.1 mg/dL (ref 0.7–1.3)
EGFR: 74 mL/min/{1.73_m2} — AB (ref >90)
Glucose: 90 mg/dl (ref 70–140)
POTASSIUM: 4.2 meq/L (ref 3.5–5.1)
Sodium: 139 mEq/L (ref 136–145)
Total Bilirubin: 0.71 mg/dL (ref 0.20–1.20)
Total Protein: 7.7 g/dL (ref 6.4–8.3)

## 2015-08-26 LAB — MANUAL DIFFERENTIAL
ALC: 5.7 10*3/uL — AB (ref 0.9–3.3)
ANC (CHCC manual diff): 55.6 10*3/uL — ABNORMAL HIGH (ref 1.5–6.5)
Band Neutrophils: 10 % (ref 0–10)
Basophil: 3 % — ABNORMAL HIGH (ref 0–2)
EOS: 3 % (ref 0–7)
LYMPH: 8 % — AB (ref 14–49)
METAMYELOCYTES PCT: 8 % — AB (ref 0–0)
MONO: 6 % (ref 0–14)
Myelocytes: 18 % — ABNORMAL HIGH (ref 0–0)
PLT EST: ADEQUATE
PROMYELO: 2 % — AB (ref 0–0)
RBC Comments: NORMAL
SEG: 42 % (ref 38–77)

## 2015-08-26 LAB — CBC WITH DIFFERENTIAL/PLATELET
HEMATOCRIT: 43.9 % (ref 38.4–49.9)
HGB: 13.9 g/dL (ref 13.0–17.1)
MCH: 27.7 pg (ref 27.2–33.4)
MCHC: 31.7 g/dL — AB (ref 32.0–36.0)
MCV: 87.3 fL (ref 79.3–98.0)
PLATELETS: 218 10*3/uL (ref 140–400)
RBC: 5.02 10*6/uL (ref 4.20–5.82)
RDW: 16.7 % — ABNORMAL HIGH (ref 11.0–14.6)
WBC: 71.3 10*3/uL (ref 4.0–10.3)

## 2015-08-26 LAB — LACTATE DEHYDROGENASE: LDH: 897 U/L — AB (ref 125–245)

## 2015-08-26 NOTE — Progress Notes (Signed)
Patient had a critical lab - WBC 78.4 Dr. Jana Hakim aware

## 2015-08-26 NOTE — Progress Notes (Signed)
Patient was inquiring what he could take for sleep.  He does take [place] periodically however does not get more then 3-5 hours of sleep.  Per Dr. Jana Hakim patient should try benadryl 25 mg which writer encouraged him to take.  Patient will update Dr. Jana Hakim as to how this is working at his next visit.  Patient also states that he was on celexa for several months last winter but caused erectile dysfunction so he took himself off this medication.  He does have alprazolam 0.5 mg and takes it periodically but wondered if he should try something else for anxiety.  Dr. Jana Hakim would like patient to exercise.  Patient states he has a "bad " back however he will try walking everyday.  Writer encouraged him to update if the anxiety and sleep issues don't improve.  Patient stated understanding.

## 2015-08-26 NOTE — Telephone Encounter (Signed)
Appointments added per pof and will have patient get a new schedule today

## 2015-08-30 ENCOUNTER — Other Ambulatory Visit: Payer: Self-pay | Admitting: Radiology

## 2015-08-31 ENCOUNTER — Other Ambulatory Visit: Payer: Self-pay | Admitting: Radiology

## 2015-08-31 ENCOUNTER — Other Ambulatory Visit (HOSPITAL_BASED_OUTPATIENT_CLINIC_OR_DEPARTMENT_OTHER): Payer: BLUE CROSS/BLUE SHIELD

## 2015-08-31 ENCOUNTER — Other Ambulatory Visit: Payer: Self-pay | Admitting: *Deleted

## 2015-08-31 DIAGNOSIS — C9210 Chronic myeloid leukemia, BCR/ABL-positive, not having achieved remission: Secondary | ICD-10-CM | POA: Diagnosis not present

## 2015-08-31 DIAGNOSIS — M25561 Pain in right knee: Secondary | ICD-10-CM

## 2015-08-31 DIAGNOSIS — M25562 Pain in left knee: Principal | ICD-10-CM

## 2015-08-31 LAB — CBC WITH DIFFERENTIAL/PLATELET
HCT: 43.7 % (ref 38.4–49.9)
HGB: 13.8 g/dL (ref 13.0–17.1)
MCH: 27.6 pg (ref 27.2–33.4)
MCHC: 31.5 g/dL — ABNORMAL LOW (ref 32.0–36.0)
MCV: 87.7 fL (ref 79.3–98.0)
Platelets: 223 10*3/uL (ref 140–400)
RBC: 4.98 10*6/uL (ref 4.20–5.82)
RDW: 16.6 % — AB (ref 11.0–14.6)
WBC: 67.9 10*3/uL — AB (ref 4.0–10.3)

## 2015-08-31 LAB — MANUAL DIFFERENTIAL
ALC: 5.4 10*3/uL — ABNORMAL HIGH (ref 0.9–3.3)
ANC (CHCC MAN DIFF): 48.9 10*3/uL — AB (ref 1.5–6.5)
Band Neutrophils: 11 % — ABNORMAL HIGH (ref 0–10)
Basophil: 4 % — ABNORMAL HIGH (ref 0–2)
Blasts: 0 % (ref 0–0)
EOS: 3 % (ref 0–7)
LYMPH: 8 % — ABNORMAL LOW (ref 14–49)
METAMYELOCYTES PCT: 7 % — AB (ref 0–0)
MONO: 10 % (ref 0–14)
MYELOCYTES: 6 % — AB (ref 0–0)
Other Cell: 0 % (ref 0–0)
PLT EST: ADEQUATE
PROMYELO: 3 % — AB (ref 0–0)
SEG: 48 % (ref 38–77)
Variant Lymph: 0 % (ref 0–0)
nRBC: 0 % (ref 0–0)

## 2015-09-01 ENCOUNTER — Encounter (HOSPITAL_COMMUNITY): Payer: Self-pay

## 2015-09-01 ENCOUNTER — Ambulatory Visit (HOSPITAL_COMMUNITY)
Admission: RE | Admit: 2015-09-01 | Discharge: 2015-09-01 | Disposition: A | Discharge status: Home / Self Care | Payer: BLUE CROSS/BLUE SHIELD | Source: Ambulatory Visit | Attending: Oncology | Admitting: Oncology

## 2015-09-01 DIAGNOSIS — D72829 Elevated white blood cell count, unspecified: Secondary | ICD-10-CM | POA: Insufficient documentation

## 2015-09-01 DIAGNOSIS — C9210 Chronic myeloid leukemia, BCR/ABL-positive, not having achieved remission: Secondary | ICD-10-CM | POA: Insufficient documentation

## 2015-09-01 LAB — CBC
HEMATOCRIT: 40.9 % (ref 39.0–52.0)
HEMOGLOBIN: 13.3 g/dL (ref 13.0–17.0)
MCH: 28.9 pg (ref 26.0–34.0)
MCHC: 32.5 g/dL (ref 30.0–36.0)
MCV: 88.7 fL (ref 78.0–100.0)
Platelets: 236 10*3/uL (ref 150–400)
RBC: 4.61 MIL/uL (ref 4.22–5.81)
RDW: 16.6 % — ABNORMAL HIGH (ref 11.5–15.5)
WBC: 65.9 10*3/uL — AB (ref 4.0–10.5)

## 2015-09-01 LAB — PROTIME-INR
INR: 0.96 (ref 0.00–1.49)
Prothrombin Time: 13 seconds (ref 11.6–15.2)

## 2015-09-01 LAB — BONE MARROW EXAM

## 2015-09-01 LAB — APTT: APTT: 27 s (ref 24–37)

## 2015-09-01 MED ORDER — FENTANYL CITRATE (PF) 100 MCG/2ML IJ SOLN
INTRAMUSCULAR | Status: AC | PRN
  Administered 2015-09-01: 25 ug via INTRAVENOUS
  Administered 2015-09-01: 50 ug via INTRAVENOUS

## 2015-09-01 MED ORDER — SODIUM CHLORIDE 0.9 % IV SOLN: INTRAVENOUS | Status: DC

## 2015-09-01 MED ORDER — MIDAZOLAM HCL 2 MG/2ML IJ SOLN
INTRAMUSCULAR | Status: AC
  Filled 2015-09-01: qty 6

## 2015-09-01 MED ORDER — FENTANYL CITRATE (PF) 100 MCG/2ML IJ SOLN
INTRAMUSCULAR | Status: AC
  Filled 2015-09-01: qty 4

## 2015-09-01 MED ORDER — MIDAZOLAM HCL 2 MG/2ML IJ SOLN
INTRAMUSCULAR | Status: AC | PRN
  Administered 2015-09-01 (×4): 1 mg via INTRAVENOUS

## 2015-09-01 NOTE — H&P (Signed)
   HPI: Patient with leukocytosis who has been seen by Dr. Jana Hakim 08/25/15 and scheduled today for image guided bone marrow biopsy  The patient has had a H&P performed within the last 30 days, all history, medications, and exam have been reviewed. The patient denies any interval changes since the H&P.  Medications: Prior to Admission medications   Medication Sig Start Date End Date Taking? Authorizing Provider  allopurinol (ZYLOPRIM) 300 MG tablet Take 1 tablet (300 mg total) by mouth daily. 08/25/15  Yes Chauncey Cruel, MD  aspirin 81 MG tablet Take 81 mg by mouth daily.   Yes Historical Provider, MD  diphenhydrAMINE (BENADRYL) 25 MG tablet Take 25 mg by mouth every 6 (six) hours as needed for sleep.   Yes Historical Provider, MD  hydroxyurea (HYDREA) 500 MG capsule Take 1 capsule (500 mg total) by mouth daily. May take with food to minimize GI side effects. 08/25/15  Yes Chauncey Cruel, MD  omeprazole (PRILOSEC) 20 MG capsule Take 20 mg by mouth daily.     Yes Historical Provider, MD  fluvastatin (LESCOL) 40 MG capsule Take 2 capsules (80 mg total) by mouth at bedtime. 02/01/14   Sueanne Margarita, MD     Vital Signs: BP 126/73 mmHg  Pulse 68  Temp(Src) 98 F (36.7 C) (Oral)  Ht _0  (1.88 m)  Wt 206 lb (93.441 kg)  BMI 26.44 kg/m2  Physical Exam  Constitutional: He is oriented to person, place, and time. No distress.  HENT:  Head: Normocephalic and atraumatic.  Cardiovascular: Normal rate and regular rhythm.  Exam reveals no gallop and no friction rub.   No murmur heard. Pulmonary/Chest: Effort normal and breath sounds normal. No respiratory distress. He has no wheezes. He has no rales.  Abdominal: Soft. Bowel sounds are normal. He exhibits no distension. There is no tenderness.  Neurological: He is alert and oriented to person, place, and time.  Skin: Skin is warm and dry. He is not diaphoretic.    Mallampati Score:  MD Evaluation Airway: WNL Heart: WNL Abdomen:  WNL Chest/ Lungs: WNL ASA  Classification: 2 Mallampati/Airway Score: Two  Labs:  CBC:  Recent Labs  08/25/15 1634 08/26/15 0937 08/31/15 0923 09/01/15 0705  WBC 78.4* 71.3* 67.9* 65.9*  HGB 13.8 13.9 13.8 13.3  HCT 44.5 43.9 43.7 40.9  PLT 224 Large platelets present 218 223 236    COAGS:  Recent Labs  09/01/15 0705  INR 0.96  APTT 27    BMP:  Recent Labs  08/26/15 0937  NA 139  K 4.2  CO2 26  GLUCOSE 90  BUN 21.4  CALCIUM 9.9  CREATININE 1.1    LIVER FUNCTION TESTS:  Recent Labs  08/26/15 0937  BILITOT 0.71  AST 42*  ALT 32  ALKPHOS 70  PROT 7.7  ALBUMIN 4.2    Assessment/Plan:  Leukocytosis Seen by Dr. Jana Hakim 08/25/15 Scheduled today for image guided bone marrow biopsy with sedation The patient has been NPO, no blood thinners taken, labs and vitals have been reviewed. Risks and Benefits discussed with the patient including, but not limited to bleeding, infection, damage to adjacent structures or low yield requiring additional tests. All of the patient's questions were answered, patient is agreeable to proceed. Consent signed and in chart.    SignedHedy Jacob 09/01/2015, 8:38 AM

## 2015-09-01 NOTE — Procedures (Signed)
Interventional Radiology Procedure Note ? ?Procedure: CT guided aspirate and core biopsy of right iliac bone ?Complications: None ?Recommendations: ?- Bedrest supine x 1 hrs ?- OTC's PRN  Pain ?- Follow biopsy results ? ?Signed, ? ?Yilia Sacca S. Sarath Privott, DO ? ? ?

## 2015-09-01 NOTE — Progress Notes (Signed)
CRITICAL VALUE ALERT  Critical value received: WBC 65.9  Date of notification:  12.29.2016  Time of notification:  0753  Critical value read back:Yes.    Nurse who received alert:  Antionette Fairy  MD notified (1st page):  WAGNER  Time of first page:  0755/ spoke to dr wagner/no new orders  MD notified (2nd page):  Time of second page:  Responding MD: dr Earleen Newport  Time MD responded:  [phone]

## 2015-09-01 NOTE — Discharge Instructions (Signed)
Bone Marrow Aspiration and Bone Marrow Biopsy, Care After °Refer to this sheet in the next few weeks. These instructions provide you with information about caring for yourself after your procedure. Your health care provider may also give you more specific instructions. Your treatment has been planned according to current medical practices, but problems sometimes occur. Call your health care provider if you have any problems or questions after your procedure. °WHAT TO EXPECT AFTER THE PROCEDURE °After your procedure, it is common to have: °· Soreness or tenderness around the puncture site. °· Bruising. °HOME CARE INSTRUCTIONS °· Take medicines only as directed by your health care provider. °· Follow your health care provider's instructions about: °· Puncture site care. °· Bandage (dressing) changes and removal. °· Bathe and shower as directed by your health care provider. °· Check your puncture site every day for signs of infection. Watch for: °· Redness, swelling, or pain. °· Fluid, blood, or pus. °· Return to your normal activities as directed by your health care provider. °· Keep all follow-up visits as directed by your health care provider. This is important. °SEEK MEDICAL CARE IF: °· You have a fever. °· You have uncontrollable bleeding. °· You have redness, swelling, or pain at the site of your puncture. °· You have fluid, blood, or pus coming from your puncture site. °  °This information is not intended to replace advice given to you by your health care provider. Make sure you discuss any questions you have with your health care provider. °  °Document Released: 03/09/2005 Document Revised: 01/04/2015 Document Reviewed: 08/11/2014 °Elsevier Interactive Patient Education ©2016 Elsevier Inc. ° ° °Moderate Conscious Sedation, Adult °Sedation is the use of medicines to promote relaxation and relieve discomfort and anxiety. Moderate conscious sedation is a type of sedation. Under moderate conscious sedation you are  less alert than normal but are still able to respond to instructions or stimulation. Moderate conscious sedation is used during short medical and dental procedures. It is milder than deep sedation or general anesthesia and allows you to return to your regular activities sooner. °LET YOUR HEALTH CARE PROVIDER KNOW ABOUT:  °· Any allergies you have. °· All medicines you are taking, including vitamins, herbs, eye drops, creams, and over-the-counter medicines. °· Use of steroids (by mouth or creams). °· Previous problems you or members of your family have had with the use of anesthetics. °· Any blood disorders you have. °· Previous surgeries you have had. °· Medical conditions you have. °· Possibility of pregnancy, if this applies. °· Use of cigarettes, alcohol, or illegal drugs. °RISKS AND COMPLICATIONS °Generally, this is a safe procedure. However, as with any procedure, problems can occur. Possible problems include: °· Oversedation. °· Trouble breathing on your own. You may need to have a breathing tube until you are awake and breathing on your own. °· Allergic reaction to any of the medicines used for the procedure. °BEFORE THE PROCEDURE °· You may have blood tests done. These tests can help show how well your kidneys and liver are working. They can also show how well your blood clots. °· A physical exam will be done.   °· Only take medicines as directed by your health care provider. You may need to stop taking medicines (such as blood thinners, aspirin, or nonsteroidal anti-inflammatory drugs) before the procedure.   °· Do not eat or drink at least 6 hours before the procedure or as directed by your health care provider. °· Arrange for a responsible adult, family member, or friend to take   after the procedure. He or she should stay with you for at least 24 hours after the procedure, until the medicine has worn off. PROCEDURE   An intravenous (IV) catheter will be inserted into one of your veins. Medicine will  be able to flow directly into your body through this catheter. You may be given medicine through this tube to help prevent pain and help you relax.  The medical or dental procedure will be done. AFTER THE PROCEDURE  You will stay in a recovery area until the medicine has worn off. Your blood pressure and pulse will be checked.   Depending on the procedure you had, you may be allowed to go home when you can tolerate liquids and your pain is under control.   This information is not intended to replace advice given to you by your health care provider. Make sure you discuss any questions you have with your health care provider.   Document Released: 05/15/2001 Document Revised: 09/10/2014 Document Reviewed: 04/27/2013 Elsevier Interactive Patient Education 2016 [place] Anesthesia, Adult, Care After Refer to this sheet in the next few weeks. These instructions provide you with information on caring for yourself after your procedure. Your health care provider may also give you more specific instructions. Your treatment has been planned according to current medical practices, but problems sometimes occur. Call your health care provider if you have any problems or questions after your procedure. WHAT TO EXPECT AFTER THE PROCEDURE After the procedure, it is typical to experience:  Sleepiness.  Nausea and vomiting. HOME CARE INSTRUCTIONS  For the first 24 hours after general anesthesia:  Have a responsible person with you.  Do not drive a car. If you are alone, do not take public transportation.  Do not drink alcohol.  Do not take medicine that has not been prescribed by your health care provider.  Do not sign important papers or make important decisions.  You may resume a normal diet and activities as directed by your health care provider.  Change bandages (dressings) as directed.  If you have questions or problems that seem related to general anesthesia, call the  hospital and ask for the anesthetist or anesthesiologist on call. SEEK MEDICAL CARE IF:  You have nausea and vomiting that continue the day after anesthesia.  You develop a rash. SEEK IMMEDIATE MEDICAL CARE IF:   You have difficulty breathing.  You have chest pain.  You have any allergic problems.   This information is not intended to replace advice given to you by your health care provider. Make sure you discuss any questions you have with your health care provider.   Document Released: 11/26/2000 Document Revised: 09/10/2014 Document Reviewed: 12/19/2011 Elsevier Interactive Patient Education [employer].

## 2015-09-05 ENCOUNTER — Other Ambulatory Visit: Payer: Self-pay | Admitting: Oncology

## 2015-09-05 MED ORDER — IMATINIB MESYLATE 400 MG PO TABS: 400.0000 mg | ORAL_TABLET | Freq: Every day | ORAL | Status: DC

## 2015-09-05 MED FILL — GLEEVEC 400 MG TABLET: 400 | 30 days supply | Qty: 30 | Fill #0

## 2015-09-08 ENCOUNTER — Other Ambulatory Visit (HOSPITAL_BASED_OUTPATIENT_CLINIC_OR_DEPARTMENT_OTHER): Payer: BLUE CROSS/BLUE SHIELD

## 2015-09-08 ENCOUNTER — Ambulatory Visit (HOSPITAL_BASED_OUTPATIENT_CLINIC_OR_DEPARTMENT_OTHER): Payer: BLUE CROSS/BLUE SHIELD | Admitting: Oncology

## 2015-09-08 VITALS — BP 130/81 | HR 69 | Temp 98.2°F | Resp 20 | Ht 74.0 in | Wt 208.4 lb

## 2015-09-08 DIAGNOSIS — C9210 Chronic myeloid leukemia, BCR/ABL-positive, not having achieved remission: Secondary | ICD-10-CM

## 2015-09-08 DIAGNOSIS — M25562 Pain in left knee: Principal | ICD-10-CM

## 2015-09-08 DIAGNOSIS — M545 Low back pain: Secondary | ICD-10-CM | POA: Diagnosis not present

## 2015-09-08 DIAGNOSIS — M25561 Pain in right knee: Secondary | ICD-10-CM

## 2015-09-08 LAB — TECHNOLOGIST REVIEW

## 2015-09-08 LAB — CBC WITH DIFFERENTIAL/PLATELET
BASO%: 2.8 % — AB (ref 0.0–2.0)
Basophils Absolute: 1.2 10*3/uL — ABNORMAL HIGH (ref 0.0–0.1)
EOS%: 2.4 % (ref 0.0–7.0)
Eosinophils Absolute: 1.1 10*3/uL — ABNORMAL HIGH (ref 0.0–0.5)
HEMATOCRIT: 40.6 % (ref 38.4–49.9)
HEMOGLOBIN: 13 g/dL (ref 13.0–17.1)
LYMPH#: 3.1 10*3/uL (ref 0.9–3.3)
LYMPH%: 7.1 % — ABNORMAL LOW (ref 14.0–49.0)
MCH: 28.1 pg (ref 27.2–33.4)
MCHC: 32.1 g/dL (ref 32.0–36.0)
MCV: 87.4 fL (ref 79.3–98.0)
MONO#: 2.3 10*3/uL — AB (ref 0.1–0.9)
MONO%: 5.4 % (ref 0.0–14.0)
NEUT%: 82.3 % — AB (ref 39.0–75.0)
NEUTROS ABS: 35.9 10*3/uL — AB (ref 1.5–6.5)
Platelets: 229 10*3/uL (ref 140–400)
RBC: 4.65 10*6/uL (ref 4.20–5.82)
RDW: 17.3 % — AB (ref 11.0–14.6)
WBC: 43.7 10*3/uL — AB (ref 4.0–10.3)
nRBC: 0 % (ref 0–0)

## 2015-09-08 NOTE — Progress Notes (Signed)
[facility]  [phone] Fax:(336) [phone]     ID: [name] [name] DOB: 11-21-1965  MR#: [id number]  OMB#:[id number]  Patient Care Team: [name] Fess, MD as PCP - General (Family Medicine) [name] Cruel, MD as Consulting Physician (Oncology) [name] Moore, MD as Consulting Physician (Neurosurgery) [name] Marble, MD as Consulting Physician (Otolaryngology) [name] Irani, MD as Consulting Physician (Gastroenterology) PCP: [name] Pac, MD OTHER MD:  CHIEF COMPLAINT:  leukocytosis  CURRENT TREATMENT:  imatinib   HISTORY OF PRESENT ILLNESS: From the original intake note:  [name] [name] (the final "e" is pronounced) had a complete blood count as part of his annual physical 10/15/2014 and this showed a white cell count of 7.3, hemoglobin 13.9, and platelets under 96,000. The differential was unremarkable.    more recently, as preop testing for anticipated back surgery he had a repeat CBC yesterday, 08/24/2015. This showed a white cell count of 72.9 thousand.  Hemoglobin and platelet count as well as creatinine were reported as normal. No differential was done. The patient was referred to Dr. Rex [name] who today repeated the CBC, finding a white cell count of 78.2 thousand, hemoglobin 14.2, and platelets 243,000. He requested we evaluate the patient urgently.    [name] [name]'s subsequent history is as detailed below   INTERVAL HISTORY: [place] today to the hematology clinic for follow-up of his chronic myeloid leukemia, accompanied by his wife [name] [name]. Since his last visit here he had a bone marrow biopsy which showed a hypercellular marrow with 0-1% blasts. Cytogenetics from that is still pending. However a BCR.ABL probe obtainedat his first visit here 08/26/2015 confirmed the [place] translocation with a ratio of 117%.  He was started on Hydreaat his initial visit to control his counts, and that brought the white cell count down from 78.4 to the current  43.7. He was started on allopurinol at the same time. He has had no side effects from either of those medications.  REVIEW OF SYSTEMS: The problem that bothers [name] [name] "everyday" is the "disc in my back". It also prevents him from exercising and particularly running, which is his favored mode. He has just joined a gym locally so he will have more options such as swimming and possibly biking. Aside from this problem and minimal seasonal allergy symptoms, a detailed review of systems today was negative.  PAST MEDICAL HISTORY: Past Medical History  Diagnosis Date  . Sinus disorder   . Arthritis   . No pertinent past medical history   . Depression   . GERD (gastroesophageal reflux disease)   . Hyperlipidemia   . OSA (obstructive sleep apnea)     mild so patient did not want to be on CPAP  . Coronary artery vasospasm (HCC)     History of MI secondary to coronary artery vasospasm    PAST SURGICAL HISTORY: Past Surgical History  Procedure Laterality Date  . Hand / finger lesion excision  2009    fx lt metacarpal  . Nasal septoplasty w/ turbinoplasty  08/13/2011    Procedure: NASAL SEPTOPLASTY WITH TURBINATE REDUCTION;  Surgeon: [name] [name];  Location: [facility];  Service: ENT;  Laterality: N/A;  with LAUP  . Cardiac catheterization  99,04    both normal, chest pain felt secondary to coronary vasospasm    FAMILY HISTORY No family history on file.  the patient's father is living, age 74. He had 2 sisters, both with breast cancer diagnosed in their 31s and 34s. The patient's mother died  from breast cancer at the age of 30 , it was diagnosed age 29. The patient has one brother and one sister, both in good health. There is no history of hematologic malignancies in the family.  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  He has a child from a first marriage, [name] [name], who lives in [place] is planning to become a  Secretary/administrator. He has been married 12 years to [name] [name] , and they have a daughter, [name] [name] , 66, at home. The patient attends the [employer] in [place]:  Not in place   HEALTH MAINTENANCE: Social History  Substance Use Topics  . Smoking status: Former Research scientist (life sciences)  . Smokeless tobacco: Not on file  . Alcohol Use: Yes     Colonoscopy: 2010/ Amedeo Plenty  PSA:  Bone density:  Lipid panel:  No Known Allergies  Current Outpatient Prescriptions  Medication Sig Dispense Refill  . allopurinol (ZYLOPRIM) 300 MG tablet Take 1 tablet (300 mg total) by mouth daily. 90 tablet 3  . aspirin 81 MG tablet Take 81 mg by mouth daily.    . diphenhydrAMINE (BENADRYL) 25 MG tablet Take 25 mg by mouth every 6 (six) hours as needed for sleep.    . fluvastatin (LESCOL) 40 MG capsule Take 2 capsules (80 mg total) by mouth at bedtime. 60 capsule 6  . hydroxyurea (HYDREA) 500 MG capsule Take 1 capsule (500 mg total) by mouth daily. May take with food to minimize GI side effects. 60 capsule 2  . imatinib (GLEEVEC) 400 MG tablet Take 1 tablet (400 mg total) by mouth daily. Take with meals and large glass of water.Caution:Chemotherapy. 90 tablet 4  . omeprazole (PRILOSEC) 20 MG capsule Take 20 mg by mouth daily.       No current facility-administered medications for this visit.    OBJECTIVE:  Middle-aged white man in no acute distress Filed Vitals:   09/08/15 1608  BP: 130/81  Pulse: 69  Temp: 98.2 F (36.8 C)  Resp: 20     Body mass index is 26.75 kg/(m^2).    ECOG FS:1 - Symptomatic but completely ambulatory  Sclerae unicteric, pupils round and equal Oropharynx clear and moist-- no thrush or other lesions No cervical or supraclavicular adenopathy Lungs no rales or rhonchi Heart regular rate and rhythm Abd soft, nontender, positive bowel sounds MSK no focal spinal tenderness, no upper extremity lymphedema Neuro: nonfocal, well oriented, appropriate affect   LAB  RESULTS: Results for [name], [name] (MRN [id number]) as of 09/08/2015 18:56  Ref. Range 08/25/2015 16:34 08/26/2015 09:37 08/31/2015 09:23 09/01/2015 07:05 09/08/2015 15:57  WBC Latest Ref Range: 4.0-10.3 10e3/uL 78.4 (HH) 71.3 (HH) 67.9 (HH) 65.9 (HH) 43.7 (H)   CMP     Component Value Date/Time   NA 139 08/26/2015 0937   K 4.2 08/26/2015 0937   CO2 26 08/26/2015 0937   GLUCOSE 90 08/26/2015 0937   BUN 21.4 08/26/2015 0937   CREATININE 1.1 08/26/2015 0937   CALCIUM 9.9 08/26/2015 0937   PROT 7.7 08/26/2015 0937   ALBUMIN 4.2 08/26/2015 0937   AST 42* 08/26/2015 0937   ALT 32 08/26/2015 0937   ALT 18 01/21/2014 0848   ALKPHOS 70 08/26/2015 0937   BILITOT 0.71 08/26/2015 0937    INo results found for: SPEP, UPEP  Lab Results  Component Value Date   WBC 43.7* 09/08/2015   NEUTROABS 35.9* 09/08/2015   HGB 13.0 09/08/2015  HCT 40.6 09/08/2015   MCV 87.4 09/08/2015   PLT 229 09/08/2015      Chemistry      Component Value Date/Time   NA 139 08/26/2015 0937   K 4.2 08/26/2015 0937   CO2 26 08/26/2015 0937   BUN 21.4 08/26/2015 0937   CREATININE 1.1 08/26/2015 0937      Component Value Date/Time   CALCIUM 9.9 08/26/2015 0937   ALKPHOS 70 08/26/2015 0937   AST 42* 08/26/2015 0937   ALT 32 08/26/2015 0937   ALT 18 01/21/2014 0848   BILITOT 0.71 08/26/2015 0937       No results found for: LABCA2  No components found for: LABCA125  No results for input(s): INR in the last 168 hours.  Urinalysis No results found for: COLORURINE, APPEARANCEUR, LABSPEC, PHURINE, GLUCOSEU, HGBUR, BILIRUBINUR, KETONESUR, PROTEINUR, UROBILINOGEN, NITRITE, LEUKOCYTESUR  STUDIES: Ct Biopsy  09/01/2015  CLINICAL DATA:  50 year old male with a history of CML. He has been referred for a bone marrow biopsy. EXAM: CT-GUIDED BIOPSY BONE MARROW BIOPSY MEDICATIONS AND MEDICAL HISTORY: Versed 4.0 mg, Fentanyl 50 mcg. Additional Medications: None. ANESTHESIA/SEDATION: Moderate sedation time: 9  minutes PROCEDURE: The procedure risks, benefits, and alternatives were explained to the patient. Questions regarding the procedure were encouraged and answered. The patient understands and consents to the procedure. Scout CT of the pelvis was performed for surgical planning purposes. The posterior pelvis was prepped with Betadinein a sterile fashion, and a sterile drape was applied covering the operative field. A sterile gown and sterile gloves were used for the procedure. Local anesthesia was provided with 1% Lidocaine. We targeted the right posterior iliac bone for biopsy. The skin and subcutaneous tissues were infiltrated with 1% lidocaine without epinephrine. A small stab incision was made with an 11 blade scalpel, and an 11 gauge Murphy needle was advanced with CT guidance to the posterior cortex. Manual forced was used to advance the needle through the posterior cortex and the stylet was removed. A bone marrow aspirate was retrieved and passed to a cytotechnologist in the room. The Murphy needle was then advanced without the stylet for a core biopsy. The core biopsy was retrieved and also passed to a cytotechnologist. Manual pressure was used for hemostasis and a sterile dressing was placed. No complications were encountered no significant blood loss was encountered. Patient tolerated the procedure well and remained hemodynamically stable throughout. FINDINGS: Scout image demonstrates safe approach to posterior iliac bone. Images during the case demonstrate placement of 11 gauge Murphy needle COMPLICATIONS: None IMPRESSION: Status post CT-guided bone marrow biopsy, with tissue specimen sent to pathology for complete histopathologic analysis Signed, [name] [name]. Earleen Newport, DO [facility] Radiology Electronically Signed   By: [name] Mckusick D.O.   On: 09/01/2015 11:24   Patient: [name] [name], [name] [name]: 09/01/2015 Client: [facility] Accession: [id number]  Received: 09/01/2015 [name] Mckusick, DO DOB: [birth year] Age: 50 Gender: M Reported: 09/02/2015 [address] Patient Ph: [phone] MRN #: [id number] [place], [place] [address] Visit #: [id number] Chart #: Phone: [phone] Fax: CC: Lurline Del, MD BONE MARROW REPORT FINAL DIAGNOSIS Diagnosis Bone Marrow, Aspirate,Biopsy, and Clot - HYPERCELLULAR BONE MARROW WITH MYELOPROLIFERATIVE NEOPLASM. - SEE COMMENT. PERIPHERAL BLOOD: - MARKED LEUKOCYTOSIS WITH FEATURES OF MYELOPROLIFERATIVE NEOPLASM. Diagnosis Note The overall morphologic features favor chronic myelogenous leukemia. Correlation with cytogenetic studies is recommended. Susanne Greenhouse MD Pathologist, Electronic Signature (Case signed 09/02/2015) GROSS AND MICROSCOPIC INFORMATION  ASSESSMENT: 50 y.o. [place] man presenting 08/25/2015 with a white  cell count of greater than 70,000, primarily myeloid cells, in the absence of any symptoms or signs of infection, with no splenomegaly by exam, basophils 4% and with no blasts seen on the peripheral smear, normal hemoglobin and platelet count  (a) Sokal score 0.64 (low risk)  (b) bone marrow biopsy 09/01/2015 shows 0% blasts  (c) electrocardiogram 11/24/2013 shows a QT/QTc of 424/422  (1) imatinib 400 mg/d started 09/06/2015  (2) bcr/abl1 baseline 08/26/2015 = 116.73%    PLAN: I reviewed Jane's situation in detail with himand his wife today. He understands we confirmed the diagnosis with a positive BCR ABL probe. We also discussed the results of his bone marrow biopsy. I gave him a copy of all those tests and explained how they are to be used.  We then discussed the general strategy. Because he is low risk I feel very comfortable starting with imatinib. We discussed the possible toxicities, side effects and complications of this agent, which she has a ready taken for 2 days and so far is tolerating well. We will be following his BCR ABL probe monthly for the first 3 months and  then every 3 months for the next 2 years. He understands the goal here is a deep molecular response which may after some years I'll allow him to safely come off the imatinib. He understands that if we do see evidence of disease progression or suboptimal response we have other agents available for rescue.  He is going off the Hydrea at this point, but continuing the allopurinol until his counts have been normal for approximately 1 month.  He is interested in having his back surgery but I would like his white cell count normalized by then. This may take a couple of months. I suggested he contact his orthopedist and requested a prescription for traction equipment he could use at home. Meanwhile he is going to be at the gym swimming and using a recumbent bike for exercise.  She will see me in a little over a month to check on tolerance and we will have his first repeat BCR ABL at that point.  Crit has a good understanding of this plan. He agrees with it. He knows to call for any problems that may develop before his next visit here.    [name] Cruel, MD   09/08/2015 6:58 PM Medical Oncology and Hematology [facility] [address] [place], [place] [address] Tel. [phone]    Fax. [phone]

## 2015-09-09 ENCOUNTER — Telehealth: Payer: Self-pay | Admitting: Pharmacist

## 2015-09-09 ENCOUNTER — Telehealth: Payer: Self-pay | Admitting: Oncology

## 2015-09-09 LAB — TISSUE HYBRIDIZATION (BONE MARROW)-NCBH

## 2015-09-09 LAB — HEPATITIS PANEL, ACUTE
HCV AB: NEGATIVE
Hep A IgM: NONREACTIVE
Hep B C IgM: NONREACTIVE
Hepatitis B Surface Ag: NEGATIVE

## 2015-09-09 LAB — CHROMOSOME ANALYSIS, BONE MARROW

## 2015-09-09 NOTE — Telephone Encounter (Signed)
Aware of appointments

## 2015-09-09 NOTE — Telephone Encounter (Signed)
09/09/15: Attempted to call patient for follow up on new oral medication: Gleevec. No answer. Left voicemail for patient to call back with any questions or concerns.    Thank you,  Montel Clock, PharmD, [facility] [phone]

## 2015-09-12 ENCOUNTER — Encounter: Payer: Self-pay | Admitting: Oncology

## 2015-09-15 ENCOUNTER — Other Ambulatory Visit: Payer: Self-pay | Admitting: *Deleted

## 2015-09-15 MED ORDER — PREGABALIN 75 MG PO CAPS: 75.0000 mg | ORAL_CAPSULE | Freq: Two times a day (BID) | ORAL | Status: DC

## 2015-09-16 ENCOUNTER — Telehealth: Payer: Self-pay | Admitting: Pharmacist

## 2015-09-16 NOTE — Telephone Encounter (Signed)
09/16/15: Attempted to reach patient for follow up on oral medication: Gleevec. No answer. Left VM for patient to call back with any questions or issues.   Thank you,  Montel Clock, PharmD, [facility] [phone]

## 2015-09-19 ENCOUNTER — Encounter: Payer: Self-pay | Admitting: Oncology

## 2015-09-20 ENCOUNTER — Encounter: Payer: Self-pay | Admitting: Oncology

## 2015-09-20 NOTE — Progress Notes (Signed)
Faxed request for prior auth for lyrica

## 2015-09-28 ENCOUNTER — Telehealth: Payer: Self-pay | Admitting: *Deleted

## 2015-09-28 NOTE — Telephone Encounter (Signed)
This RN obtained prescription dated today for Lyrica requesting prior authorization.  Prescription taken to [facility].

## 2015-10-03 ENCOUNTER — Telehealth: Payer: Self-pay | Admitting: *Deleted

## 2015-10-03 NOTE — Telephone Encounter (Signed)
Received per fax after hours call from pt's pharmacy - [facility] - ph number [phone] stating need to speak with MD about need for prior authorization for Lyrica.  Noted above is being processed by [employer].  This note will be forwarded for appropriate follow up.

## 2015-10-04 ENCOUNTER — Other Ambulatory Visit (HOSPITAL_BASED_OUTPATIENT_CLINIC_OR_DEPARTMENT_OTHER): Payer: BLUE CROSS/BLUE SHIELD

## 2015-10-04 DIAGNOSIS — M25561 Pain in right knee: Secondary | ICD-10-CM

## 2015-10-04 DIAGNOSIS — M25562 Pain in left knee: Principal | ICD-10-CM

## 2015-10-04 DIAGNOSIS — C9210 Chronic myeloid leukemia, BCR/ABL-positive, not having achieved remission: Secondary | ICD-10-CM | POA: Diagnosis not present

## 2015-10-04 LAB — COMPREHENSIVE METABOLIC PANEL
ALT: 21 U/L (ref 0–55)
AST: 21 U/L (ref 5–34)
Albumin: 3.7 g/dL (ref 3.5–5.0)
Alkaline Phosphatase: 73 U/L (ref 40–150)
Anion Gap: 9 mEq/L (ref 3–11)
BILIRUBIN TOTAL: 0.47 mg/dL (ref 0.20–1.20)
BUN: 16.7 mg/dL (ref 7.0–26.0)
CO2: 25 meq/L (ref 22–29)
CREATININE: 1.2 mg/dL (ref 0.7–1.3)
Calcium: 8.6 mg/dL (ref 8.4–10.4)
Chloride: 107 mEq/L (ref 98–109)
EGFR: 71 mL/min/{1.73_m2} — AB (ref >90)
GLUCOSE: 89 mg/dL (ref 70–140)
Potassium: 4.1 mEq/L (ref 3.5–5.1)
SODIUM: 140 meq/L (ref 136–145)
TOTAL PROTEIN: 6.7 g/dL (ref 6.4–8.3)

## 2015-10-04 LAB — CBC WITH DIFFERENTIAL/PLATELET
BASO%: 2.4 % — AB (ref 0.0–2.0)
Basophils Absolute: 0.1 10*3/uL (ref 0.0–0.1)
EOS%: 5.8 % (ref 0.0–7.0)
Eosinophils Absolute: 0.3 10*3/uL (ref 0.0–0.5)
HCT: 39.4 % (ref 38.4–49.9)
HGB: 12.8 g/dL — ABNORMAL LOW (ref 13.0–17.1)
LYMPH%: 20 % (ref 14.0–49.0)
MCH: 29.2 pg (ref 27.2–33.4)
MCHC: 32.5 g/dL (ref 32.0–36.0)
MCV: 90 fL (ref 79.3–98.0)
MONO#: 0.5 10*3/uL (ref 0.1–0.9)
MONO%: 8 % (ref 0.0–14.0)
NEUT%: 63.8 % (ref 39.0–75.0)
NEUTROS ABS: 3.8 10*3/uL (ref 1.5–6.5)
Platelets: 174 10*3/uL (ref 140–400)
RBC: 4.38 10*6/uL (ref 4.20–5.82)
RDW: 15.8 % — ABNORMAL HIGH (ref 11.0–14.6)
WBC: 5.9 10*3/uL (ref 4.0–10.3)
lymph#: 1.2 10*3/uL (ref 0.9–3.3)

## 2015-10-10 ENCOUNTER — Telehealth: Payer: Self-pay | Admitting: Pharmacist

## 2015-10-10 NOTE — Telephone Encounter (Signed)
10/10/15: Attempted to reach patient for follow up on oral medication: Gleevec. No answer. Left VM for patient to call back with any questions or issues.   Thank you,  Montel Clock, PharmD, [facility] [phone]

## 2015-10-12 ENCOUNTER — Other Ambulatory Visit: Payer: Self-pay | Admitting: Oncology

## 2015-10-13 ENCOUNTER — Encounter: Payer: Self-pay | Admitting: Oncology

## 2015-10-13 NOTE — Progress Notes (Signed)
Sent [employer] req for lyrica prior auth. See prev notes

## 2015-10-17 ENCOUNTER — Telehealth: Payer: Self-pay | Admitting: Oncology

## 2015-10-17 ENCOUNTER — Other Ambulatory Visit (HOSPITAL_BASED_OUTPATIENT_CLINIC_OR_DEPARTMENT_OTHER): Payer: BLUE CROSS/BLUE SHIELD

## 2015-10-17 ENCOUNTER — Ambulatory Visit (HOSPITAL_BASED_OUTPATIENT_CLINIC_OR_DEPARTMENT_OTHER): Payer: BLUE CROSS/BLUE SHIELD | Admitting: Oncology

## 2015-10-17 VITALS — BP 136/58 | HR 84 | Temp 98.6°F | Resp 18 | Ht 74.0 in | Wt 210.5 lb

## 2015-10-17 DIAGNOSIS — R197 Diarrhea, unspecified: Secondary | ICD-10-CM | POA: Diagnosis not present

## 2015-10-17 DIAGNOSIS — G4700 Insomnia, unspecified: Secondary | ICD-10-CM | POA: Diagnosis not present

## 2015-10-17 DIAGNOSIS — M25561 Pain in right knee: Secondary | ICD-10-CM

## 2015-10-17 DIAGNOSIS — C9210 Chronic myeloid leukemia, BCR/ABL-positive, not having achieved remission: Secondary | ICD-10-CM

## 2015-10-17 DIAGNOSIS — M545 Low back pain: Secondary | ICD-10-CM

## 2015-10-17 DIAGNOSIS — F419 Anxiety disorder, unspecified: Secondary | ICD-10-CM

## 2015-10-17 DIAGNOSIS — M25562 Pain in left knee: Principal | ICD-10-CM

## 2015-10-17 LAB — CHCC SMEAR

## 2015-10-17 LAB — CBC WITH DIFFERENTIAL/PLATELET
BASO%: 0.3 % (ref 0.0–2.0)
Basophils Absolute: 0 10*3/uL (ref 0.0–0.1)
EOS ABS: 0.1 10*3/uL (ref 0.0–0.5)
EOS%: 2 % (ref 0.0–7.0)
HEMATOCRIT: 40.4 % (ref 38.4–49.9)
HEMOGLOBIN: 13 g/dL (ref 13.0–17.1)
LYMPH#: 0.5 10*3/uL — AB (ref 0.9–3.3)
LYMPH%: 7.2 % — ABNORMAL LOW (ref 14.0–49.0)
MCH: 28.8 pg (ref 27.2–33.4)
MCHC: 32.1 g/dL (ref 32.0–36.0)
MCV: 89.7 fL (ref 79.3–98.0)
MONO#: 0.8 10*3/uL (ref 0.1–0.9)
MONO%: 13.2 % (ref 0.0–14.0)
NEUT%: 77.3 % — ABNORMAL HIGH (ref 39.0–75.0)
NEUTROS ABS: 4.9 10*3/uL (ref 1.5–6.5)
PLATELETS: 150 10*3/uL (ref 140–400)
RBC: 4.5 10*6/uL (ref 4.20–5.82)
RDW: 15.6 % — AB (ref 11.0–14.6)
WBC: 6.4 10*3/uL (ref 4.0–10.3)

## 2015-10-17 MED ORDER — GABAPENTIN 300 MG PO CAPS: 300.0000 mg | ORAL_CAPSULE | Freq: Every day | ORAL | Status: DC

## 2015-10-17 MED ORDER — LOPERAMIDE HCL 2 MG PO CAPS: 2.0000 mg | ORAL_CAPSULE | ORAL | Status: DC | PRN

## 2015-10-17 NOTE — Telephone Encounter (Signed)
Appointments made and avs printed °

## 2015-10-17 NOTE — Progress Notes (Signed)
[name] [name]  [phone] Fax:(336) [phone]     ID: ENDY EASTERLY DOB: 03/07/1966  MR#: [id number]  DSK#:[id number]  Patient Care Team: Hulan Fess, [name], [name], [name], [name], MD as Consulting Physician (Gastroenterology) PCP: Gennette Pac, MD OTHER MD:  CHIEF COMPLAINT:  leukocytosis  CURRENT TREATMENT:  imatinib   HISTORY OF PRESENT ILLNESS: From the original intake note:  [name] (the final "e" is pronounced) had a complete blood count as part of his annual physical 10/15/2014 and this showed a white cell count of 7.3, hemoglobin 13.9, and platelets under 96,000. The differential was unremarkable.    more recently, as preop testing for anticipated back surgery he had a repeat CBC yesterday, 08/24/2015. This showed a white cell count of 72.9 thousand.  Hemoglobin and platelet count as well as creatinine were reported as normal. No differential was done. The patient was referred to Dr. Rex Kras who today repeated the CBC, finding a white cell count of 78.2 thousand, hemoglobin 14.2, and platelets 243,000. He requested we evaluate the patient urgently.    Charlene's subsequent history is as detailed below   INTERVAL HISTORY: [name] returns today for follow-up of his chronic myeloid leukemia, accompanied by his wife [name]. He continues on imatinib. He was on this just about a month when we had his first repeat B CR ABL test which shows a 50% reduction. This is on track for an optimal response, which would be less than 10% at 3 months).  He is tolerating the imatinib generally well. He has some diarrhea, which is the most common side effect. He had 4-5 small loose sometimes liquid bowel movements yesterday. He has not started Imodium. The second problem is  insomnia. This is a chronic issue but is probably a little bit worse. This is likely due to anxiety, he feels. He has tried Benadryl which works sometimes and took a small lorazepam that he had from his primary care physician and that helped a little bit as well. The third problem is fatigue. Part of the issue is that he can't exercise because of his back pain. He is doing swimming, but only about an hour and a half a week, as opposed to all the running he was doing previously.  He does have a history of sleep apnea. He was considerably more overweight when this was diagnosed. This has not been rechecked since his significant weight loss.  REVIEW OF SYSTEMS: He has not had any problems with rash or cramps or fever. He has a little bit raw in the perineal area. The back pain persists and we were not able to obtain Lyrica for him because of insurance reasons. The detailed review of systems today was otherwise stable.  PAST MEDICAL HISTORY: Past Medical History  Diagnosis Date  . Sinus disorder   . Arthritis   . No pertinent past medical history   . Depression   . GERD (gastroesophageal reflux disease)   . Hyperlipidemia   . OSA (obstructive sleep apnea)     mild so patient did not want to be on CPAP  . Coronary artery vasospasm (HCC)     History of MI secondary to coronary artery vasospasm    PAST SURGICAL HISTORY: Past Surgical History  Procedure Laterality Date  . Hand / finger lesion excision  2009  fx lt metacarpal  . Nasal septoplasty w/ turbinoplasty  08/13/2011    Procedure: NASAL SEPTOPLASTY WITH TURBINATE REDUCTION;  Surgeon: Tyson Alias;  Location: [facility];  Service: ENT;  Laterality: N/A;  with LAUP  . Cardiac catheterization  99,04    both normal, chest pain felt secondary to coronary vasospasm    FAMILY HISTORY No family history on file.  the patient's father is living, age 73. He had 2 sisters, both with breast cancer diagnosed in their 82s and  65s. The patient's mother died from breast cancer at the age of 24 , it was diagnosed age 61. The patient has one brother and one sister, both in good health. There is no history of hematologic malignancies in the family.  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  He has a child from a first marriage, [name], who lives in [place] is planning to become a Secretary/administrator. He has been married 12 years to [name] , and they have a daughter, [name] , 100, at home. The patient attends the [employer] in [place]:  Not in place   HEALTH MAINTENANCE: Social History  Substance Use Topics  . Smoking status: Former Research scientist (life sciences)  . Smokeless tobacco: Not on file  . Alcohol Use: Yes     Colonoscopy: 2010/ Amedeo Plenty  PSA:  Bone density:  Lipid panel:  No Known Allergies  Current Outpatient Prescriptions  Medication Sig Dispense Refill  . allopurinol (ZYLOPRIM) 300 MG tablet Take 1 tablet (300 mg total) by mouth daily. 90 tablet 3  . aspirin 81 MG tablet Take 81 mg by mouth daily.    . diphenhydrAMINE (BENADRYL) 25 MG tablet Take 25 mg by mouth every 6 (six) hours as needed for sleep.    . fluvastatin (LESCOL) 40 MG capsule Take 2 capsules (80 mg total) by mouth at bedtime. 60 capsule 6  . gabapentin (NEURONTIN) 300 MG capsule Take 1 capsule (300 mg total) by mouth at bedtime. 90 capsule 4  . imatinib (GLEEVEC) 400 MG tablet Take 1 tablet (400 mg total) by mouth daily. Take with meals and large glass of water.Caution:Chemotherapy. 90 tablet 4  . loperamide (IMODIUM) 2 MG capsule Take 1 capsule (2 mg total) by mouth as needed for diarrhea or loose stools. 30 capsule 4  . omeprazole (PRILOSEC) 20 MG capsule Take 20 mg by mouth daily.       No current facility-administered medications for this visit.    OBJECTIVE:  Middle-aged white man who appears well Filed Vitals:   10/17/15 1206  BP: 136/58  Pulse: 84  Temp:  98.6 F (37 C)  Resp: 18     Body mass index is 27.02 kg/(m^2).    ECOG FS:1 - Symptomatic but completely ambulatory  Sclerae unicteric, EOMs intact Oropharynx clear, dentition in good repair No cervical or supraclavicular adenopathy Lungs no rales or rhonchi Heart regular rate and rhythm Abd soft, nontender, positive bowel sounds MSK no focal spinal tenderness, no upper extremity lymphedema Neuro: nonfocal, well oriented, appropriate affect     LAB RESULTS: Results for JERVON, REAM (MRN [id number]) as of 10/17/2015 12:50  Ref. Range 09/01/2015 07:05 09/08/2015 15:57 10/04/2015 16:05 10/17/2015 11:51  WBC Latest Ref Range: 4.0-10.3 10e3/uL 65.9 (HH) 43.7 (H) 5.9 6.4    CMP     Component Value Date/Time   NA 140 10/04/2015 1605   K 4.1 10/04/2015 1605  CO2 25 10/04/2015 1605   GLUCOSE 89 10/04/2015 1605   BUN 16.7 10/04/2015 1605   CREATININE 1.2 10/04/2015 1605   CALCIUM 8.6 10/04/2015 1605   PROT 6.7 10/04/2015 1605   ALBUMIN 3.7 10/04/2015 1605   AST 21 10/04/2015 1605   ALT 21 10/04/2015 1605   ALT 18 01/21/2014 0848   ALKPHOS 73 10/04/2015 1605   BILITOT 0.47 10/04/2015 1605    INo results found for: SPEP, UPEP  Lab Results  Component Value Date   WBC 6.4 10/17/2015   NEUTROABS 4.9 10/17/2015   HGB 13.0 10/17/2015   HCT 40.4 10/17/2015   MCV 89.7 10/17/2015   PLT 150 10/17/2015      Chemistry      Component Value Date/Time   NA 140 10/04/2015 1605   K 4.1 10/04/2015 1605   CO2 25 10/04/2015 1605   BUN 16.7 10/04/2015 1605   CREATININE 1.2 10/04/2015 1605      Component Value Date/Time   CALCIUM 8.6 10/04/2015 1605   ALKPHOS 73 10/04/2015 1605   AST 21 10/04/2015 1605   ALT 21 10/04/2015 1605   ALT 18 01/21/2014 0848   BILITOT 0.47 10/04/2015 1605       No results found for: LABCA2  No components found for: LABCA125  No results for input(s): INR in the last 168 hours.  Urinalysis No results found for: COLORURINE, APPEARANCEUR,  LABSPEC, PHURINE, GLUCOSEU, HGBUR, BILIRUBINUR, KETONESUR, PROTEINUR, UROBILINOGEN, NITRITE, LEUKOCYTESUR  STUDIES: No results found.GROSS AND MICROSCOPIC INFORMATION  ASSESSMENT: 50 y.o. [place] man presenting 08/25/2015 with a white cell count of greater than 70,000, primarily myeloid cells, in the absence of any symptoms or signs of infection, with no splenomegaly by exam, basophils 4% and with no blasts seen on the peripheral smear, normal hemoglobin and platelet count  (a) Sokal score 0.64 (low risk)  (b) bone marrow biopsy 09/01/2015 shows 0% blasts  (c) electrocardiogram 11/24/2013 shows a QT/QTc of 424/422  (1) imatinib 400 mg/d started 09/06/2015  (2) bcr/abl1 baseline 08/26/2015 = 116.73%, with WBC 43.7  (a) first month reading ratio 46% with comeplete hematologic response    PLAN: Cameran is on track for an optimal response. We are going to check his BCR ABL again on the last week in February and the last week in March. He will see me about 10 days after those tests to review results. If everything is looking as well as I expect next month we will stop his allopurinol at that time. We will recheck uric acid with both those determinations.  Today we discussed how to deal with the diarrhea. Specifically he will take an Imodium with the second diarrheal stool and then with every subsequent stool. If that does not control the problem we will add Questran.  We also discussed sleep habits. He will always wake up in the morning at the same time, including weekends and medications. He will keep the room as cold as tolerated at night. I think if he does those 2 things that will already help. In addition I am writing for Neurontin for him to take 300 mg at bedtime together with Benadryl 25 mg at bedtime. He will let me know if this does not help with the issues  At this point I'm very encouraged that he is having a good response to the imatinib with good tolerance. I anticipate he will be  able to have his back surgery sometime in 4-6 weeks.   Chauncey Cruel, MD   10/17/2015 12:49  PM Medical Oncology and Hematology [facility] [address] [place], [place] [address] Tel. [phone]    Fax. [phone]

## 2015-10-20 ENCOUNTER — Encounter: Payer: Self-pay | Admitting: Oncology

## 2015-10-20 NOTE — Progress Notes (Signed)
See prev notes. lyrica was stopped. I never recd resp for prior auth.

## 2015-11-01 ENCOUNTER — Telehealth: Payer: Self-pay | Admitting: Oncology

## 2015-11-01 ENCOUNTER — Other Ambulatory Visit: Payer: BLUE CROSS/BLUE SHIELD

## 2015-11-01 ENCOUNTER — Other Ambulatory Visit: Payer: Self-pay | Admitting: *Deleted

## 2015-11-01 ENCOUNTER — Ambulatory Visit (HOSPITAL_BASED_OUTPATIENT_CLINIC_OR_DEPARTMENT_OTHER): Payer: BLUE CROSS/BLUE SHIELD

## 2015-11-01 DIAGNOSIS — M25561 Pain in right knee: Secondary | ICD-10-CM

## 2015-11-01 DIAGNOSIS — C9210 Chronic myeloid leukemia, BCR/ABL-positive, not having achieved remission: Secondary | ICD-10-CM

## 2015-11-01 DIAGNOSIS — M25562 Pain in left knee: Secondary | ICD-10-CM

## 2015-11-01 LAB — CBC WITH DIFFERENTIAL/PLATELET
BASO%: 1.1 % (ref 0.0–2.0)
BASOS ABS: 0.1 10*3/uL (ref 0.0–0.1)
EOS%: 7.4 % — ABNORMAL HIGH (ref 0.0–7.0)
Eosinophils Absolute: 0.5 10*3/uL (ref 0.0–0.5)
HEMATOCRIT: 41.1 % (ref 38.4–49.9)
HEMOGLOBIN: 13.7 g/dL (ref 13.0–17.1)
LYMPH#: 1.2 10*3/uL (ref 0.9–3.3)
LYMPH%: 18.8 % (ref 14.0–49.0)
MCH: 29.6 pg (ref 27.2–33.4)
MCHC: 33.3 g/dL (ref 32.0–36.0)
MCV: 88.8 fL (ref 79.3–98.0)
MONO#: 0.5 10*3/uL (ref 0.1–0.9)
MONO%: 8.5 % (ref 0.0–14.0)
NEUT#: 4.1 10*3/uL (ref 1.5–6.5)
NEUT%: 64.2 % (ref 39.0–75.0)
PLATELETS: 215 10*3/uL (ref 140–400)
RBC: 4.63 10*6/uL (ref 4.20–5.82)
RDW: 14.9 % — ABNORMAL HIGH (ref 11.0–14.6)
WBC: 6.4 10*3/uL (ref 4.0–10.3)

## 2015-11-01 LAB — CHCC SMEAR

## 2015-11-01 LAB — URIC ACID: Uric Acid, Serum: 3.6 mg/dl (ref 2.6–7.4)

## 2015-11-01 NOTE — Telephone Encounter (Signed)
Patient came in today for lab appointment. Lab appointment had been cxd based on additional pof's sent 2/13 changing appointments and awaiting response from provider re clarification. Per patient lab today and  March f/u with GM is based on pending surgery 3/16 and he will need to see GM before then or cancel his surgery until April. Spoke with desk nurse and lab appointment for 2/28 restored - added lab/fu with GM 3/10 - lab/fu 4/10 will remain as scheduled. Patient aware and given new schedule for March and April. Patient requested that all lab results be sent to Dr. Heide Spark at [facility] - left message for desk nurse re this request. All pof's from 2/13 now resolved.

## 2015-11-11 ENCOUNTER — Ambulatory Visit (HOSPITAL_BASED_OUTPATIENT_CLINIC_OR_DEPARTMENT_OTHER): Payer: BLUE CROSS/BLUE SHIELD | Admitting: Oncology

## 2015-11-11 ENCOUNTER — Other Ambulatory Visit (HOSPITAL_BASED_OUTPATIENT_CLINIC_OR_DEPARTMENT_OTHER): Payer: BLUE CROSS/BLUE SHIELD

## 2015-11-11 VITALS — BP 138/66 | HR 64 | Temp 97.9°F | Resp 18 | Ht 74.0 in | Wt 211.0 lb

## 2015-11-11 DIAGNOSIS — C9210 Chronic myeloid leukemia, BCR/ABL-positive, not having achieved remission: Secondary | ICD-10-CM

## 2015-11-11 DIAGNOSIS — M25561 Pain in right knee: Secondary | ICD-10-CM

## 2015-11-11 DIAGNOSIS — M25562 Pain in left knee: Principal | ICD-10-CM

## 2015-11-11 LAB — CBC WITH DIFFERENTIAL/PLATELET
BASO%: 1.2 % (ref 0.0–2.0)
BASOS ABS: 0.1 10*3/uL (ref 0.0–0.1)
EOS%: 8.5 % — AB (ref 0.0–7.0)
Eosinophils Absolute: 0.5 10*3/uL (ref 0.0–0.5)
HCT: 42.5 % (ref 38.4–49.9)
HEMOGLOBIN: 13.9 g/dL (ref 13.0–17.1)
LYMPH#: 1.1 10*3/uL (ref 0.9–3.3)
LYMPH%: 19.9 % (ref 14.0–49.0)
MCH: 29.4 pg (ref 27.2–33.4)
MCHC: 32.8 g/dL (ref 32.0–36.0)
MCV: 89.6 fL (ref 79.3–98.0)
MONO#: 0.5 10*3/uL (ref 0.1–0.9)
MONO%: 9.8 % (ref 0.0–14.0)
NEUT%: 60.6 % (ref 39.0–75.0)
NEUTROS ABS: 3.4 10*3/uL (ref 1.5–6.5)
Platelets: 173 10*3/uL (ref 140–400)
RBC: 4.74 10*6/uL (ref 4.20–5.82)
RDW: 15.5 % — AB (ref 11.0–14.6)
WBC: 5.6 10*3/uL (ref 4.0–10.3)

## 2015-11-11 LAB — CHCC SMEAR

## 2015-11-11 NOTE — Progress Notes (Signed)
[place]  Telephone:(336) [phone] Fax:(336) [phone]     ID: [name] [name] DOB: 1965/10/22  MR#: [id number]  EXB#:[id number]  Patient Care Team: Hulan Fess, [name], [name], [name], [name], MD as Consulting Physician (Gastroenterology) PCP: Gennette Pac, MD OTHER MD:  CHIEF COMPLAINT:  Chronic myeloid leukemia  CURRENT TREATMENT:  imatinib   HISTORY OF PRESENT ILLNESS: From the original intake note:  [name] (the final "e" is pronounced) had a complete blood count as part of his annual physical 10/15/2014 and this showed a white cell count of 7.3, hemoglobin 13.9, and platelets under 96,000. The differential was unremarkable.    more recently, as preop testing for anticipated back surgery he had a repeat CBC yesterday, 08/24/2015. This showed a white cell count of 72.9 thousand.  Hemoglobin and platelet count as well as creatinine were reported as normal. No differential was done. The patient was referred to Dr. Rex Kras who today repeated the CBC, finding a white cell count of 78.2 thousand, hemoglobin 14.2, and platelets 243,000. He requested we evaluate the patient urgently.    Linkyn's subsequent history is as detailed below   INTERVAL HISTORY: Maurie returns today for follow-up of his chronic myeloid leukemia. We obtained a repeat BCR ABL 11/01/2015, and this showed a ratio of 55.9, not significantly changed from the 1 after the first month.--He continues to tolerate the pill well, the only symptom he has noted is a slight increase in his bowel movement frequency. Before he would have 1 or 2 a day, nonweight tends to have 2 or 3 a day, rarely 4. There is no true diarrhea. There has been no fever, rash, or shortness of breath. He does have a little bit above cough in  the morning which has been present he tells me since he had his septoplasty many years ago. This has not changed. Of course he has significant back pain and he is scheduled for back surgery next week.  REVIEW OF SYSTEMS: He continues to exercise chiefly by swimming and some biking. Hopefully once he finishes rehabilitation from his upcoming surgery he will be able to return to walking and possibly jogging. A detailed review of systems today was otherwise stable.  PAST MEDICAL HISTORY: Past Medical History  Diagnosis Date  . Sinus disorder   . Arthritis   . No pertinent past medical history   . Depression   . GERD (gastroesophageal reflux disease)   . Hyperlipidemia   . OSA (obstructive sleep apnea)     mild so patient did not want to be on CPAP  . Coronary artery vasospasm (HCC)     History of MI secondary to coronary artery vasospasm    PAST SURGICAL HISTORY: Past Surgical History  Procedure Laterality Date  . Hand / finger lesion excision  2009    fx lt metacarpal  . Nasal septoplasty w/ turbinoplasty  08/13/2011    Procedure: NASAL SEPTOPLASTY WITH TURBINATE REDUCTION;  Surgeon: Tyson Alias;  Location: [facility];  Service: ENT;  Laterality: N/A;  with LAUP  . Cardiac catheterization  99,04    both normal, chest pain felt secondary to coronary vasospasm    FAMILY HISTORY No family history on file.  the patient's father is living, age 52. He had 2 sisters, both with breast cancer diagnosed in their 15s and  26s. The patient's mother died from breast cancer at the age of 52 , it was diagnosed age 52. The patient has one brother and one sister, both in good health. There is no history of hematologic malignancies in the family.  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  He has a child from a first marriage, [name], who lives in [place] is planning to become a Secretary/administrator. He has been married 12  years to [name] , and they have a daughter, [name] , 14, at home. The patient attends the [employer] in [place]:  Not in place   HEALTH MAINTENANCE: Social History  Substance Use Topics  . Smoking status: Former Research scientist (life sciences)  . Smokeless tobacco: Not on file  . Alcohol Use: Yes     Colonoscopy: 2010/ Amedeo Plenty  PSA:  Bone density:  Lipid panel:  No Known Allergies  Current Outpatient Prescriptions  Medication Sig Dispense Refill  . allopurinol (ZYLOPRIM) 300 MG tablet Take 1 tablet (300 mg total) by mouth daily. 90 tablet 3  . aspirin 81 MG tablet Take 81 mg by mouth daily.    . diphenhydrAMINE (BENADRYL) 25 MG tablet Take 25 mg by mouth every 6 (six) hours as needed for sleep.    . fluvastatin (LESCOL) 40 MG capsule Take 2 capsules (80 mg total) by mouth at bedtime. 60 capsule 6  . gabapentin (NEURONTIN) 300 MG capsule Take 1 capsule (300 mg total) by mouth at bedtime. 90 capsule 4  . imatinib (GLEEVEC) 400 MG tablet Take 1 tablet (400 mg total) by mouth daily. Take with meals and large glass of water.Caution:Chemotherapy. 90 tablet 4  . loperamide (IMODIUM) 2 MG capsule Take 1 capsule (2 mg total) by mouth as needed for diarrhea or loose stools. 30 capsule 4  . omeprazole (PRILOSEC) 20 MG capsule Take 20 mg by mouth daily.       No current facility-administered medications for this visit.    OBJECTIVE:  Middle-aged white man In no acute distress Filed Vitals:   11/11/15 0937  BP: 138/66  Pulse: 64  Temp: 97.9 F (36.6 C)  Resp: 18     Body mass index is 27.08 kg/(m^2).    ECOG FS:1 - Symptomatic but completely ambulatory  Sclerae unicteric, pupils round and equal Oropharynx clear and moist-- no thrush or other lesions No cervical or supraclavicular adenopathy Lungs no rales or rhonchi Heart regular rate and rhythm Abd soft, nontender, positive bowel sounds MSK no focal spinal tenderness, no upper extremity lymphedema Neuro: nonfocal, well  oriented, appropriate affect   LAB RESULTS: CMP     Component Value Date/Time   NA 140 10/04/2015 1605   K 4.1 10/04/2015 1605   CO2 25 10/04/2015 1605   GLUCOSE 89 10/04/2015 1605   BUN 16.7 10/04/2015 1605   CREATININE 1.2 10/04/2015 1605   CALCIUM 8.6 10/04/2015 1605   PROT 6.7 10/04/2015 1605   ALBUMIN 3.7 10/04/2015 1605   AST 21 10/04/2015 1605   ALT 21 10/04/2015 1605   ALT 18 01/21/2014 0848   ALKPHOS 73 10/04/2015 1605   BILITOT 0.47 10/04/2015 1605    INo results found for: SPEP, UPEP  Lab Results  Component Value Date   WBC 5.6 11/11/2015   NEUTROABS 3.4 11/11/2015   HGB 13.9 11/11/2015   HCT 42.5 11/11/2015   MCV 89.6 11/11/2015   PLT 173 11/11/2015      Chemistry  Component Value Date/Time   NA 140 10/04/2015 1605   K 4.1 10/04/2015 1605   CO2 25 10/04/2015 1605   BUN 16.7 10/04/2015 1605   CREATININE 1.2 10/04/2015 1605      Component Value Date/Time   CALCIUM 8.6 10/04/2015 1605   ALKPHOS 73 10/04/2015 1605   AST 21 10/04/2015 1605   ALT 21 10/04/2015 1605   ALT 18 01/21/2014 0848   BILITOT 0.47 10/04/2015 1605       No results found for: LABCA2  No components found for: LABCA125  No results for input(s): INR in the last 168 hours.  Urinalysis No results found for: COLORURINE, APPEARANCEUR, LABSPEC, PHURINE, GLUCOSEU, HGBUR, BILIRUBINUR, KETONESUR, PROTEINUR, UROBILINOGEN, NITRITE, LEUKOCYTESUR  STUDIES: No results found.GROSS AND MICROSCOPIC INFORMATION  ASSESSMENT: 50 y.o. [place] man presenting 08/25/2015 with a white cell count of greater than 70,000, primarily myeloid cells, in the absence of any symptoms or signs of infection, with no splenomegaly by exam, basophils 4% and with no blasts seen on the peripheral smear, normal hemoglobin and platelet count  (a) Sokal score 0.64 (low risk)  (b) bone marrow biopsy 09/01/2015 shows 0% blasts  (c) electrocardiogram 11/24/2013 shows a QT/QTc of 424/422  (1) imatinib 400 mg/d  started 09/06/2015  (2) bcr/abl1 baseline 08/26/2015 = 116.73%, with WBC 43.7  (a) first month reading ratio 46% with complete hematologic response  (b) second month reading 56% with continuing hematologic response    PLAN: Kennard had a very favorable initial response to the [place] but there has been no significant change in the second month. We are still at about 50%.  What we are really looking for is trends and I don't know if we're going to see further response next month, stability, or relapse. He is going to have his next lab work March 28 and see me early April to discuss those results.  If we don't get the response we want it may be prudent to proceed to repeat bone marrow biopsy repeat cytogenetics and then switch to a different tyrosine kinase inhibitor.  As far as his upcoming back surgery is concerned, he is in a complete hematologic response, meaning his CBC has completely normalized. There is no problem with him proceeding to that surgery.  He has a good understanding of all this. He will call with any problems that may develop before his next visit here.   Chauncey Cruel, MD   11/11/2015 10:10 AM Medical Oncology and Hematology [facility] [address] [place], [place] [address] Tel. [phone]    Fax. [phone]

## 2015-11-13 NOTE — Addendum Note (Signed)
Addended by: Chauncey Cruel on: 11/13/2015 03:47 PM   Modules accepted: Orders

## 2015-11-14 ENCOUNTER — Ambulatory Visit: Payer: BLUE CROSS/BLUE SHIELD | Admitting: Hematology and Oncology

## 2015-11-29 ENCOUNTER — Other Ambulatory Visit (HOSPITAL_BASED_OUTPATIENT_CLINIC_OR_DEPARTMENT_OTHER): Payer: BLUE CROSS/BLUE SHIELD

## 2015-11-29 ENCOUNTER — Other Ambulatory Visit: Payer: BLUE CROSS/BLUE SHIELD

## 2015-11-29 DIAGNOSIS — M25561 Pain in right knee: Secondary | ICD-10-CM

## 2015-11-29 DIAGNOSIS — C9210 Chronic myeloid leukemia, BCR/ABL-positive, not having achieved remission: Secondary | ICD-10-CM | POA: Diagnosis not present

## 2015-11-29 DIAGNOSIS — M25562 Pain in left knee: Principal | ICD-10-CM

## 2015-11-29 LAB — CBC WITH DIFFERENTIAL/PLATELET
BASO%: 1.2 % (ref 0.0–2.0)
BASOS ABS: 0.1 10*3/uL (ref 0.0–0.1)
EOS ABS: 0.4 10*3/uL (ref 0.0–0.5)
EOS%: 5.2 % (ref 0.0–7.0)
HCT: 42.6 % (ref 38.4–49.9)
HEMOGLOBIN: 13.7 g/dL (ref 13.0–17.1)
LYMPH%: 16.9 % (ref 14.0–49.0)
MCH: 28.4 pg (ref 27.2–33.4)
MCHC: 32.1 g/dL (ref 32.0–36.0)
MCV: 88.6 fL (ref 79.3–98.0)
MONO#: 0.6 10*3/uL (ref 0.1–0.9)
MONO%: 7.8 % (ref 0.0–14.0)
NEUT#: 5.1 10*3/uL (ref 1.5–6.5)
NEUT%: 68.9 % (ref 39.0–75.0)
Platelets: 267 10*3/uL (ref 140–400)
RBC: 4.81 10*6/uL (ref 4.20–5.82)
RDW: 15.1 % — AB (ref 11.0–14.6)
WBC: 7.4 10*3/uL (ref 4.0–10.3)
lymph#: 1.3 10*3/uL (ref 0.9–3.3)

## 2015-11-29 LAB — CHCC SMEAR

## 2015-11-29 LAB — URIC ACID: URIC ACID, SERUM: 3.4 mg/dL (ref 2.6–7.4)

## 2015-12-12 ENCOUNTER — Other Ambulatory Visit (HOSPITAL_BASED_OUTPATIENT_CLINIC_OR_DEPARTMENT_OTHER): Payer: BLUE CROSS/BLUE SHIELD

## 2015-12-12 ENCOUNTER — Telehealth: Payer: Self-pay | Admitting: Oncology

## 2015-12-12 ENCOUNTER — Ambulatory Visit (HOSPITAL_BASED_OUTPATIENT_CLINIC_OR_DEPARTMENT_OTHER): Payer: BLUE CROSS/BLUE SHIELD | Admitting: Oncology

## 2015-12-12 VITALS — BP 128/86 | HR 63 | Temp 98.2°F | Resp 19 | Ht 74.0 in | Wt 214.7 lb

## 2015-12-12 DIAGNOSIS — M25562 Pain in left knee: Principal | ICD-10-CM

## 2015-12-12 DIAGNOSIS — C9210 Chronic myeloid leukemia, BCR/ABL-positive, not having achieved remission: Secondary | ICD-10-CM | POA: Diagnosis not present

## 2015-12-12 DIAGNOSIS — M25561 Pain in right knee: Secondary | ICD-10-CM

## 2015-12-12 LAB — CBC WITH DIFFERENTIAL/PLATELET
BASO%: 1.1 % (ref 0.0–2.0)
Basophils Absolute: 0.1 10*3/uL (ref 0.0–0.1)
EOS%: 7.9 % — AB (ref 0.0–7.0)
Eosinophils Absolute: 0.5 10*3/uL (ref 0.0–0.5)
HEMATOCRIT: 41.4 % (ref 38.4–49.9)
HEMOGLOBIN: 13.4 g/dL (ref 13.0–17.1)
LYMPH#: 1.6 10*3/uL (ref 0.9–3.3)
LYMPH%: 23.9 % (ref 14.0–49.0)
MCH: 28.8 pg (ref 27.2–33.4)
MCHC: 32.3 g/dL (ref 32.0–36.0)
MCV: 89.2 fL (ref 79.3–98.0)
MONO#: 0.6 10*3/uL (ref 0.1–0.9)
MONO%: 9.6 % (ref 0.0–14.0)
NEUT#: 3.8 10*3/uL (ref 1.5–6.5)
NEUT%: 57.5 % (ref 39.0–75.0)
PLATELETS: 189 10*3/uL (ref 140–400)
RBC: 4.64 10*6/uL (ref 4.20–5.82)
RDW: 15.4 % — AB (ref 11.0–14.6)
WBC: 6.6 10*3/uL (ref 4.0–10.3)

## 2015-12-12 LAB — CHCC SMEAR

## 2015-12-12 NOTE — Telephone Encounter (Signed)
Gave patient avs report and appointments for May  °

## 2015-12-12 NOTE — Progress Notes (Signed)
[place]  [phone] Fax:(336) [phone]     ID: NEKHI LIWANAG DOB: Feb 18, 1966  MR#: [id number]  GDJ#:[id number]  Patient Care Team: Hulan Fess, [name], [name], [name], [name], MD as Consulting Physician (Gastroenterology) PCP: Gennette Pac, MD OTHER MD:  CHIEF COMPLAINT:  Chronic myeloid leukemia  CURRENT TREATMENT:  imatinib   HISTORY OF PRESENT ILLNESS: From the original intake note:  [name] (the final "e" is pronounced) had a complete blood count as part of his annual physical 10/15/2014 and this showed a white cell count of 7.3, hemoglobin 13.9, and platelets under 96,000. The differential was unremarkable.    more recently, as preop testing for anticipated back surgery he had a repeat CBC yesterday, 08/24/2015. This showed a white cell count of 72.9 thousand.  Hemoglobin and platelet count as well as creatinine were reported as normal. No differential was done. The patient was referred to Dr. Rex Kras who today repeated the CBC, finding a white cell count of 78.2 thousand, hemoglobin 14.2, and platelets 243,000. He requested we evaluate the patient urgently.    Garv's subsequent history is as detailed below   INTERVAL HISTORY: Adarsh returns today for follow-up of his chronic myeloid leukemia accompanied by his wife [name].. He continues on imatinib at 400 mg daily he still has several bowel movements daily, which are soft, not diarrhea. He has Imodium available but has not felt the need to take it. He has had no other side effects from this medication.  Since his last visit herehe underwent back surgery and his pain has resolved. He is walking, using the elliptical, but not yet running or doing weights. I think that is prudent.  REVIEW OF  SYSTEMS: A detailed review of systems today was otherwise noncontributory  PAST MEDICAL HISTORY: Past Medical History  Diagnosis Date  . Sinus disorder   . Arthritis   . No pertinent past medical history   . Depression   . GERD (gastroesophageal reflux disease)   . Hyperlipidemia   . OSA (obstructive sleep apnea)     mild so patient did not want to be on CPAP  . Coronary artery vasospasm (HCC)     History of MI secondary to coronary artery vasospasm    PAST SURGICAL HISTORY: Past Surgical History  Procedure Laterality Date  . Hand / finger lesion excision  2009    fx lt metacarpal  . Nasal septoplasty w/ turbinoplasty  08/13/2011    Procedure: NASAL SEPTOPLASTY WITH TURBINATE REDUCTION;  Surgeon: Tyson Alias;  Location: [facility];  Service: ENT;  Laterality: N/A;  with LAUP  . Cardiac catheterization  99,04    both normal, chest pain felt secondary to coronary vasospasm    FAMILY HISTORY No family history on file.  the patient's father is living, age 72. He had 2 sisters, both with breast cancer diagnosed in their 56s and 51s. The patient's mother died from breast cancer at the age of 28 , it was diagnosed age 16. The patient has one brother and one sister, both in good health. There is no history of hematologic malignancies in the family.  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  He has a child from a first marriage, [name], who lives in [place] is planning to become  a sonography technician. He has been married 12 years to [name] , and they have a daughter, [name] , 64, at home. The patient attends the [employer] in [place]:  Not in place   HEALTH MAINTENANCE: Social History  Substance Use Topics  . Smoking status: Former Research scientist (life sciences)  . Smokeless tobacco: Not on file  . Alcohol Use: Yes     Colonoscopy: 2010/ Amedeo Plenty  PSA:  Bone density:  Lipid panel:  No Known  Allergies  Current Outpatient Prescriptions  Medication Sig Dispense Refill  . aspirin 81 MG tablet Take 81 mg by mouth daily.    . diphenhydrAMINE (BENADRYL) 25 MG tablet Take 25 mg by mouth every 6 (six) hours as needed for sleep.    Marland Kitchen gabapentin (NEURONTIN) 300 MG capsule Take 1 capsule (300 mg total) by mouth at bedtime. 90 capsule 4  . imatinib (GLEEVEC) 400 MG tablet Take 1 tablet (400 mg total) by mouth daily. Take with meals and large glass of water.Caution:Chemotherapy. 90 tablet 4  . loperamide (IMODIUM) 2 MG capsule Take 1 capsule (2 mg total) by mouth as needed for diarrhea or loose stools. 30 capsule 4  . omeprazole (PRILOSEC) 20 MG capsule Take 20 mg by mouth daily.       No current facility-administered medications for this visit.    OBJECTIVE:  Middle-aged white man who appears well Filed Vitals:   12/12/15 1433  BP: 128/86  Pulse: 63  Temp: 98.2 F (36.8 C)  Resp: 19     Body mass index is 27.55 kg/(m^2).    ECOG FS:0 - Asymptomatic  Sclerae unicteric, EOMs intact Oropharynx clear, dentition in good repair No cervical or supraclavicular adenopathy Lungs no rales or rhonchi Heart regular rate and rhythm Abd soft, nontender, positive bowel sounds MSK no focal spinal tenderness, no upper extremity lymphedema Neuro: nonfocal, well oriented, appropriate affect  LAB RESULTS: CMP     Component Value Date/Time   NA 140 10/04/2015 1605   K 4.1 10/04/2015 1605   CO2 25 10/04/2015 1605   GLUCOSE 89 10/04/2015 1605   BUN 16.7 10/04/2015 1605   CREATININE 1.2 10/04/2015 1605   CALCIUM 8.6 10/04/2015 1605   PROT 6.7 10/04/2015 1605   ALBUMIN 3.7 10/04/2015 1605   AST 21 10/04/2015 1605   ALT 21 10/04/2015 1605   ALT 18 01/21/2014 0848   ALKPHOS 73 10/04/2015 1605   BILITOT 0.47 10/04/2015 1605    INo results found for: SPEP, UPEP  Lab Results  Component Value Date   WBC 6.6 12/12/2015   NEUTROABS 3.8 12/12/2015   HGB 13.4 12/12/2015   HCT 41.4 12/12/2015    MCV 89.2 12/12/2015   PLT 189 12/12/2015      Chemistry      Component Value Date/Time   NA 140 10/04/2015 1605   K 4.1 10/04/2015 1605   CO2 25 10/04/2015 1605   BUN 16.7 10/04/2015 1605   CREATININE 1.2 10/04/2015 1605      Component Value Date/Time   CALCIUM 8.6 10/04/2015 1605   ALKPHOS 73 10/04/2015 1605   AST 21 10/04/2015 1605   ALT 21 10/04/2015 1605   ALT 18 01/21/2014 0848   BILITOT 0.47 10/04/2015 1605       No results found for: LABCA2  No components found for: LABCA125  No results for input(s): INR in the last 168 hours.  Urinalysis No results found for: COLORURINE, APPEARANCEUR, [place], [place], [place], [place], [place], [place], [place], [place],  NITRITE, LEUKOCYTESUR  STUDIES: No results found.GROSS AND MICROSCOPIC INFORMATION  ASSESSMENT: 50 y.o. [place] man presenting 08/25/2015 with a white cell count of greater than 70,000, primarily myeloid cells, in the absence of any symptoms or signs of infection, with no splenomegaly by exam, basophils 4% and with no blasts seen on the peripheral smear, normal hemoglobin and platelet count  (a) Sokal score 0.64 (low risk)  (b) bone marrow biopsy 09/01/2015 shows 0% blasts  (c) electrocardiogram 11/24/2013 shows a QT/QTc of 424/422  (1) imatinib 400 mg/d started 09/06/2015  (2) bcr/abl1 baseline 08/26/2015 = 116.73%, with WBC 43.7  (a) first month reading ratio 46% with complete hematologic response  (b) second month reading 56% with continuing hematologic response  (c) third month reading 40%   (i) no ABL mutation identified March 2017    PLAN: We spent approximately 45 minutes going over Burnard's situation. So far he has achieved a complete hematologic response, which continues, but only a minor molecular response. He is still over 35%. Optimally he would be less than 10% at this point.  I checked for a mutation in the ABL receptor gene but so far he has not developed 1.  We have 3  choices. We could continue as we are doing, and continue to follow monthly  BCR.ABL ratio is and so long as they are continuing to decline make no other changes. We could double the dose of imatinib now. This of course would entail more side effects. We could switch to dasatinib at this point. There is nothing wrong with that option except cost and the fact that he is so comfortable with imatinib that he really hates to give it up or increase the dose for that matter.  After much discussion we decided we would continue the imatinib at the current dose and repeat a BCR.ABL reading in a month. He will see me shortly after that to discuss results and we will review this decision at that time.  Krishawn has a good understanding of this plan. He agrees with it. He knows the goal of treatment in  His case remains the attainment of a deep molecular response. He will call with any problems that may develop before the next visit.   Chauncey Cruel, MD   12/12/2015 7:04 PM Medical Oncology and Hematology [facility] [address] [place], [place] [address] Tel. [phone]    Fax. [phone]

## 2015-12-19 ENCOUNTER — Ambulatory Visit: Payer: BLUE CROSS/BLUE SHIELD | Admitting: Hematology and Oncology

## 2016-01-06 ENCOUNTER — Other Ambulatory Visit: Payer: BLUE CROSS/BLUE SHIELD

## 2016-01-10 ENCOUNTER — Other Ambulatory Visit (HOSPITAL_BASED_OUTPATIENT_CLINIC_OR_DEPARTMENT_OTHER): Payer: BLUE CROSS/BLUE SHIELD

## 2016-01-10 DIAGNOSIS — C9210 Chronic myeloid leukemia, BCR/ABL-positive, not having achieved remission: Secondary | ICD-10-CM | POA: Diagnosis not present

## 2016-01-10 DIAGNOSIS — M25562 Pain in left knee: Principal | ICD-10-CM

## 2016-01-10 DIAGNOSIS — M25561 Pain in right knee: Secondary | ICD-10-CM

## 2016-01-10 LAB — CBC WITH DIFFERENTIAL/PLATELET
BASO%: 0.9 % (ref 0.0–2.0)
Basophils Absolute: 0.1 10*3/uL (ref 0.0–0.1)
EOS%: 5.2 % (ref 0.0–7.0)
Eosinophils Absolute: 0.3 10*3/uL (ref 0.0–0.5)
HEMATOCRIT: 40.4 % (ref 38.4–49.9)
HEMOGLOBIN: 12.8 g/dL — AB (ref 13.0–17.1)
LYMPH#: 1 10*3/uL (ref 0.9–3.3)
LYMPH%: 15.7 % (ref 14.0–49.0)
MCH: 28.7 pg (ref 27.2–33.4)
MCHC: 31.8 g/dL — AB (ref 32.0–36.0)
MCV: 90.2 fL (ref 79.3–98.0)
MONO#: 0.6 10*3/uL (ref 0.1–0.9)
MONO%: 9.3 % (ref 0.0–14.0)
NEUT#: 4.4 10*3/uL (ref 1.5–6.5)
NEUT%: 68.9 % (ref 39.0–75.0)
Platelets: 189 10*3/uL (ref 140–400)
RBC: 4.48 10*6/uL (ref 4.20–5.82)
RDW: 17 % — AB (ref 11.0–14.6)
WBC: 6.5 10*3/uL (ref 4.0–10.3)

## 2016-01-10 LAB — CHCC SMEAR

## 2016-01-13 ENCOUNTER — Telehealth: Payer: Self-pay | Admitting: Pharmacist

## 2016-01-13 NOTE — Telephone Encounter (Signed)
01/13/16: Attempted to reach patient for follow up on oral medication: Gleevec. No answer. Left VM for patient to call back with any questions or issues.   Thank you,  Montel Clock, PharmD, [facility] [phone]

## 2016-01-25 ENCOUNTER — Other Ambulatory Visit: Payer: Self-pay | Admitting: Cardiology

## 2016-01-25 ENCOUNTER — Ambulatory Visit
Admission: RE | Admit: 2016-01-25 | Discharge: 2016-01-25 | Disposition: A | Discharge status: Home / Self Care | Payer: BLUE CROSS/BLUE SHIELD | Source: Ambulatory Visit | Attending: Cardiology | Admitting: Cardiology

## 2016-01-25 DIAGNOSIS — R079 Chest pain, unspecified: Secondary | ICD-10-CM

## 2016-01-25 DIAGNOSIS — Z5181 Encounter for therapeutic drug level monitoring: Secondary | ICD-10-CM

## 2016-01-27 ENCOUNTER — Telehealth: Payer: Self-pay | Admitting: Oncology

## 2016-01-27 ENCOUNTER — Ambulatory Visit (HOSPITAL_BASED_OUTPATIENT_CLINIC_OR_DEPARTMENT_OTHER): Payer: BLUE CROSS/BLUE SHIELD | Admitting: Oncology

## 2016-01-27 VITALS — BP 117/76 | HR 65 | Temp 98.5°F | Resp 18 | Wt 214.1 lb

## 2016-01-27 DIAGNOSIS — C9210 Chronic myeloid leukemia, BCR/ABL-positive, not having achieved remission: Secondary | ICD-10-CM | POA: Diagnosis not present

## 2016-01-27 LAB — IMATINIB RESISTANCE IN CML (GENPATH)

## 2016-01-27 NOTE — Progress Notes (Signed)
[place] [place]  Telephone:(336) [phone] Fax:(336) [phone]     ID: CALEL PISARSKI DOB: Aug 08, 1966  MR#: [id number]  HOZ#:[id number]  Patient Care Team: Hulan Fess, [name], [name], [name], [name], [name], [name], MD OTHER MD:  CHIEF COMPLAINT:  Chronic myeloid leukemia  CURRENT TREATMENT:  imatinib   HISTORY OF PRESENT ILLNESS: From the original intake note:  [name] [name] (the final "e" is pronounced) had a complete blood count as part of his annual physical 10/15/2014 and this showed a white cell count of 7.3, hemoglobin 13.9, and platelets under 96,000. The differential was unremarkable.    more recently, as preop testing for anticipated back surgery he had a repeat CBC yesterday, 08/24/2015. This showed a white cell count of 72.9 thousand.  Hemoglobin and platelet count as well as creatinine were reported as normal. No differential was done. The patient was referred to Dr. Rex Kras who today repeated the CBC, finding a white cell count of 78.2 thousand, hemoglobin 14.2, and platelets 243,000. He requested we evaluate the patient urgently.    Manu's subsequent history is as detailed below   INTERVAL HISTORY: [name] [name] returns today for follow-up of his chronic myeloid leukemia. He has been on imatinib since the first week in January. He had a good initial response then a brief plateau at now the Percentage has dropped to less than 11%, which at 4 months is close to criteria for "early response". He is tolerating the imatinib well. He does have some nausea but he takes it with breakfast and that helps with that. He doesn't take any anti-emetics with it. He also has  increased bowel movements, 3-4 day instead of 1-2 as before. This is not something that bothers him particularly. He has not had any rash, fatigue or other symptoms.   REVIEW OF SYSTEMS: [name] has made a Recovery from his back surgery and he is running, swimming, and doing weights. He was developing some chest wall pain and saw Casandra Doffing to make sure this was not a cardiac problem. He had an exercise tolerance test 01/21/2014. The problem appears to have been all musculoskeletal. Aside from these issues [name] [name] is working full-time, and planning to go to [employer] in early August with his family. A detailed review of systems was otherwise noncontributory  PAST MEDICAL HISTORY: Past Medical History  Diagnosis Date  . Sinus disorder   . Arthritis   . No pertinent past medical history   . Depression   . GERD (gastroesophageal reflux disease)   . Hyperlipidemia   . OSA (obstructive sleep apnea)     mild so patient did not want to be on CPAP  . Coronary artery vasospasm (HCC)     History of MI secondary to coronary artery vasospasm    PAST SURGICAL HISTORY: Past Surgical History  Procedure Laterality Date  . Hand / finger lesion excision  2009    fx lt metacarpal  . Nasal septoplasty w/ turbinoplasty  08/13/2011    Procedure: NASAL SEPTOPLASTY WITH TURBINATE REDUCTION;  Surgeon: Tyson Alias;  Location: [facility];  Service: ENT;  Laterality: N/A;  with LAUP  . Cardiac catheterization  99,04    both normal, chest pain felt secondary to coronary vasospasm    FAMILY  HISTORY No family history on file.  the patient's father is living, age 22. He had 2 sisters, both with breast cancer diagnosed in their 17s and 26s. The patient's mother died from breast cancer at the age of 47 , it was diagnosed age 40. The patient has one brother and one sister, both in good health. There is no history of hematologic malignancies in the family.  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  He has a child from a first marriage, [name] [name], who lives in [place] is planning to become a Secretary/administrator. He has been married 12 years to [name] , and they have a daughter, [name] [name] , 28, at home. The patient attends the [employer] in [place]:  Not in place   HEALTH MAINTENANCE: Social History  Substance Use Topics  . Smoking status: Former Research scientist (life sciences)  . Smokeless tobacco: Not on file  . Alcohol Use: Yes     Colonoscopy: 2010/ Amedeo Plenty  PSA:  Bone density:  Lipid panel:  No Known Allergies  Current Outpatient Prescriptions  Medication Sig Dispense Refill  . aspirin 81 MG tablet Take 81 mg by mouth daily.    . diphenhydrAMINE (BENADRYL) 25 MG tablet Take 25 mg by mouth every 6 (six) hours as needed for sleep.    Marland Kitchen gabapentin (NEURONTIN) 300 MG capsule Take 1 capsule (300 mg total) by mouth at bedtime. 90 capsule 4  . imatinib (GLEEVEC) 400 MG tablet Take 1 tablet (400 mg total) by mouth daily. Take with meals and large glass of water.Caution:Chemotherapy. 90 tablet 4  . loperamide (IMODIUM) 2 MG capsule Take 1 capsule (2 mg total) by mouth as needed for diarrhea or loose stools. 30 capsule 4  . omeprazole (PRILOSEC) 20 MG capsule Take 20 mg by mouth daily.       No current facility-administered medications for this visit.    OBJECTIVE:  Middle-aged white man In no acute distress Filed Vitals:   01/27/16 0946  BP: 117/76  Pulse: 65  Temp: 98.5 F (36.9 C)  Resp: 18     Body mass index is 27.48 kg/(m^2).    ECOG FS:1 - Symptomatic but completely ambulatory  Sclerae unicteric, pupils round and equal Oropharynx clear and moist-- no thrush or other lesions No cervical or supraclavicular adenopathy Lungs no rales or rhonchi Heart regular rate and rhythm Abd soft, nontender, positive bowel sounds, no splenomegaly MSK no focal spinal tenderness, no upper extremity lymphedema Neuro: nonfocal,  well oriented, appropriate affect   LAB RESULTS: CMP     Component Value Date/Time   NA 140 10/04/2015 1605   K 4.1 10/04/2015 1605   CO2 25 10/04/2015 1605   GLUCOSE 89 10/04/2015 1605   BUN 16.7 10/04/2015 1605   CREATININE 1.2 10/04/2015 1605   CALCIUM 8.6 10/04/2015 1605   PROT 6.7 10/04/2015 1605   ALBUMIN 3.7 10/04/2015 1605   AST 21 10/04/2015 1605   ALT 21 10/04/2015 1605   ALT 18 01/21/2014 0848   ALKPHOS 73 10/04/2015 1605   BILITOT 0.47 10/04/2015 1605    INo results found for: SPEP, UPEP  Lab Results  Component Value Date   WBC 6.5 01/10/2016   NEUTROABS 4.4 01/10/2016   HGB 12.8* 01/10/2016   HCT 40.4 01/10/2016   MCV 90.2 01/10/2016   PLT 189 01/10/2016      Chemistry      Component Value Date/Time   NA  140 10/04/2015 1605   K 4.1 10/04/2015 1605   CO2 25 10/04/2015 1605   BUN 16.7 10/04/2015 1605   CREATININE 1.2 10/04/2015 1605      Component Value Date/Time   CALCIUM 8.6 10/04/2015 1605   ALKPHOS 73 10/04/2015 1605   AST 21 10/04/2015 1605   ALT 21 10/04/2015 1605   ALT 18 01/21/2014 0848   BILITOT 0.47 10/04/2015 1605       No results found for: LABCA2  No components found for: LABCA125  No results for input(s): INR in the last 168 hours.  Urinalysis No results found for: COLORURINE, APPEARANCEUR, LABSPEC, PHURINE, GLUCOSEU, HGBUR, BILIRUBINUR, KETONESUR, PROTEINUR, UROBILINOGEN, NITRITE, LEUKOCYTESUR  STUDIES: Dg Chest 2 View  01/25/2016  CLINICAL DATA:  Recent chest pain, 1 month duration. EXAM: CHEST  2 VIEW COMPARISON:  08/04/2014 FINDINGS: Heart size is normal. Mediastinal shadows are normal. The lungs are clear. The vascularity is normal. No effusions. No bony abnormality. IMPRESSION: Normal chest Electronically Signed   By: Nelson Chimes M.D.   On: 01/25/2016 16:14  GROSS AND MICROSCOPIC INFORMATION  ASSESSMENT: 50 y.o. [place] man presenting 08/25/2015 with a white cell count of greater than 70,000, primarily myeloid  cells, in the absence of any symptoms or signs of infection, with no splenomegaly by exam, basophils 4% and with no blasts seen on the peripheral smear, normal hemoglobin and platelet count  (a) Sokal score 0.64 (low risk)  (b) bone marrow biopsy 09/01/2015 shows 0% blasts  (c) electrocardiogram 11/24/2013 shows a QT/QTc of 424/422  (1) imatinib 400 mg/d started 09/06/2015  (2) bcr/abl1 baseline 08/26/2015 = 116.73%, with WBC 43.7  (a) first month reading ratio 46% with complete hematologic response  (b) second month reading 56% with continuing hematologic response  (c) third month reading 40%   (i) no ABL mutation identified March 2017  (d) fourth month reading 10.9%    PLAN: Nikola is close to meeting " early response" criteria. His response to imatinib has not been steady. I don't have a good explanation for that. It turns out that he has been taking his omeprazole in the morning together with the imatinib and perhaps there has been an issue regarding absorption. From now on his going to take the omeprazole at night.  He takes the imatinib in the morning with a fairly hefty breakfast, which helps with the nausea problem. He can take Imodium if he wishes for the increased number of bowel movements but he is not that uncomfortable with that.  At this point, then, we are continuing with the imatinib. He will have his next set of labs the first week in June and then the second week in August. I'm going to call him with the June results and then he will see me again at the end of August to discuss those results.  He has a good understanding of this plan. He agrees with it. He knows to call for any problems that may develop before his next visit here.   Chauncey Cruel, MD   01/27/2016 10:11 AM Medical Oncology and Hematology [facility] [address] [place], [place] [address] Tel. [phone]    Fax. [phone]

## 2016-01-27 NOTE — Telephone Encounter (Signed)
appt made and avs printed °

## 2016-02-06 ENCOUNTER — Encounter: Payer: Self-pay | Admitting: *Deleted

## 2016-02-06 NOTE — Progress Notes (Signed)
Received transthoracic echocardiogram per Dr Einar Gip obtained on 02/02/2016 at [place].  Reading states left ventricle cavity is normal in size with EF of 66%.  Copy given to MD and hardcopy sent to HIM to be scanned into chart.

## 2016-02-08 ENCOUNTER — Other Ambulatory Visit: Payer: Self-pay | Admitting: Cardiology

## 2016-02-08 DIAGNOSIS — R931 Abnormal findings on diagnostic imaging of heart and coronary circulation: Secondary | ICD-10-CM

## 2016-02-09 ENCOUNTER — Ambulatory Visit
Admission: RE | Admit: 2016-02-09 | Discharge: 2016-02-09 | Disposition: A | Discharge status: Home / Self Care | Payer: BLUE CROSS/BLUE SHIELD | Source: Ambulatory Visit | Attending: Cardiology | Admitting: Cardiology

## 2016-02-09 DIAGNOSIS — R931 Abnormal findings on diagnostic imaging of heart and coronary circulation: Secondary | ICD-10-CM

## 2016-02-16 ENCOUNTER — Other Ambulatory Visit: Payer: BLUE CROSS/BLUE SHIELD

## 2016-02-21 ENCOUNTER — Other Ambulatory Visit (HOSPITAL_BASED_OUTPATIENT_CLINIC_OR_DEPARTMENT_OTHER): Payer: BLUE CROSS/BLUE SHIELD

## 2016-02-21 DIAGNOSIS — M25562 Pain in left knee: Principal | ICD-10-CM

## 2016-02-21 DIAGNOSIS — C9210 Chronic myeloid leukemia, BCR/ABL-positive, not having achieved remission: Secondary | ICD-10-CM | POA: Diagnosis not present

## 2016-02-21 DIAGNOSIS — M25561 Pain in right knee: Secondary | ICD-10-CM

## 2016-02-21 LAB — COMPREHENSIVE METABOLIC PANEL
ALBUMIN: 3.8 g/dL (ref 3.5–5.0)
ALT: 18 U/L (ref 0–55)
AST: 20 U/L (ref 5–34)
Alkaline Phosphatase: 66 U/L (ref 40–150)
Anion Gap: 7 mEq/L (ref 3–11)
BUN: 21.6 mg/dL (ref 7.0–26.0)
CO2: 26 mEq/L (ref 22–29)
Calcium: 8.9 mg/dL (ref 8.4–10.4)
Chloride: 107 mEq/L (ref 98–109)
Creatinine: 1.2 mg/dL (ref 0.7–1.3)
EGFR: 70 mL/min/{1.73_m2} — ABNORMAL LOW (ref >90)
GLUCOSE: 60 mg/dL — AB (ref 70–140)
POTASSIUM: 4.3 meq/L (ref 3.5–5.1)
SODIUM: 140 meq/L (ref 136–145)
TOTAL PROTEIN: 6.8 g/dL (ref 6.4–8.3)
Total Bilirubin: 0.85 mg/dL (ref 0.20–1.20)

## 2016-02-21 LAB — CBC WITH DIFFERENTIAL/PLATELET
BASO%: 1.3 % (ref 0.0–2.0)
Basophils Absolute: 0.1 10*3/uL (ref 0.0–0.1)
EOS%: 6.9 % (ref 0.0–7.0)
Eosinophils Absolute: 0.3 10*3/uL (ref 0.0–0.5)
HEMATOCRIT: 41.8 % (ref 38.4–49.9)
HEMOGLOBIN: 13.6 g/dL (ref 13.0–17.1)
LYMPH#: 1.1 10*3/uL (ref 0.9–3.3)
LYMPH%: 21.3 % (ref 14.0–49.0)
MCH: 30.7 pg (ref 27.2–33.4)
MCHC: 32.6 g/dL (ref 32.0–36.0)
MCV: 93.9 fL (ref 79.3–98.0)
MONO#: 0.4 10*3/uL (ref 0.1–0.9)
MONO%: 8.4 % (ref 0.0–14.0)
NEUT%: 62.1 % (ref 39.0–75.0)
NEUTROS ABS: 3.1 10*3/uL (ref 1.5–6.5)
Platelets: 177 10*3/uL (ref 140–400)
RBC: 4.45 10*6/uL (ref 4.20–5.82)
RDW: 16.7 % — AB (ref 11.0–14.6)
WBC: 5 10*3/uL (ref 4.0–10.3)

## 2016-02-21 LAB — CHCC SMEAR

## 2016-03-17 ENCOUNTER — Encounter: Payer: Self-pay | Admitting: Oncology

## 2016-03-28 ENCOUNTER — Other Ambulatory Visit: Payer: Self-pay | Admitting: Oncology

## 2016-03-28 MED ORDER — IMATINIB MESYLATE 100 MG PO TABS: 200.0000 mg | ORAL_TABLET | Freq: Every day | ORAL | 3 refills | Status: DC

## 2016-03-30 ENCOUNTER — Other Ambulatory Visit: Payer: Self-pay | Admitting: *Deleted

## 2016-03-30 MED ORDER — IMATINIB MESYLATE 400 MG PO TABS: 400.0000 mg | ORAL_TABLET | Freq: Every day | ORAL | 4 refills | Status: DC

## 2016-03-30 MED ORDER — IMATINIB MESYLATE 100 MG PO TABS: 200.0000 mg | ORAL_TABLET | Freq: Every day | ORAL | 3 refills | Status: DC

## 2016-03-30 NOTE — Telephone Encounter (Signed)
This RN spoke with pt per his call regarding need for [facility] prescription with increased dosing to be sent to [facility] stated request for fill is to be faxed to this office.  This RN also obtained call from [facility] requesting [facility] prescription- return call number given as [phone]. Reference number [id number] [place]  Called above and obtained fax number for script as [phone].  Note per [facility] no [facility] with this verified fax number in system.  Obtained signed script and new orders faxed to above with verification as received.

## 2016-04-10 ENCOUNTER — Other Ambulatory Visit (HOSPITAL_BASED_OUTPATIENT_CLINIC_OR_DEPARTMENT_OTHER): Payer: BLUE CROSS/BLUE SHIELD

## 2016-04-10 DIAGNOSIS — C9210 Chronic myeloid leukemia, BCR/ABL-positive, not having achieved remission: Secondary | ICD-10-CM

## 2016-04-10 DIAGNOSIS — M25562 Pain in left knee: Principal | ICD-10-CM

## 2016-04-10 DIAGNOSIS — M25561 Pain in right knee: Secondary | ICD-10-CM

## 2016-04-10 LAB — COMPREHENSIVE METABOLIC PANEL
ALT: 22 U/L (ref 0–55)
ANION GAP: 9 meq/L (ref 3–11)
AST: 25 U/L (ref 5–34)
Albumin: 3.9 g/dL (ref 3.5–5.0)
Alkaline Phosphatase: 66 U/L (ref 40–150)
BUN: 19.8 mg/dL (ref 7.0–26.0)
CALCIUM: 9.1 mg/dL (ref 8.4–10.4)
CO2: 26 mEq/L (ref 22–29)
CREATININE: 1.2 mg/dL (ref 0.7–1.3)
Chloride: 106 mEq/L (ref 98–109)
EGFR: 71 mL/min/{1.73_m2} — ABNORMAL LOW (ref >90)
Glucose: 75 mg/dl (ref 70–140)
POTASSIUM: 3.9 meq/L (ref 3.5–5.1)
Sodium: 142 mEq/L (ref 136–145)
Total Bilirubin: 0.79 mg/dL (ref 0.20–1.20)
Total Protein: 6.7 g/dL (ref 6.4–8.3)

## 2016-04-10 LAB — CBC WITH DIFFERENTIAL/PLATELET
BASO%: 0.7 % (ref 0.0–2.0)
Basophils Absolute: 0 10*3/uL (ref 0.0–0.1)
EOS ABS: 0.5 10*3/uL (ref 0.0–0.5)
EOS%: 9.4 % — AB (ref 0.0–7.0)
HCT: 40.1 % (ref 38.4–49.9)
HEMOGLOBIN: 13.5 g/dL (ref 13.0–17.1)
LYMPH%: 27.1 % (ref 14.0–49.0)
MCH: 31.3 pg (ref 27.2–33.4)
MCHC: 33.7 g/dL (ref 32.0–36.0)
MCV: 92.8 fL (ref 79.3–98.0)
MONO#: 0.6 10*3/uL (ref 0.1–0.9)
MONO%: 10.3 % (ref 0.0–14.0)
NEUT%: 52.5 % (ref 39.0–75.0)
NEUTROS ABS: 2.9 10*3/uL (ref 1.5–6.5)
Platelets: 181 10*3/uL (ref 140–400)
RBC: 4.32 10*6/uL (ref 4.20–5.82)
RDW: 14 % (ref 11.0–14.6)
WBC: 5.5 10*3/uL (ref 4.0–10.3)
lymph#: 1.5 10*3/uL (ref 0.9–3.3)

## 2016-04-10 LAB — CHCC SMEAR

## 2016-05-01 ENCOUNTER — Encounter: Payer: Self-pay | Admitting: Oncology

## 2016-05-01 ENCOUNTER — Telehealth: Payer: Self-pay | Admitting: Oncology

## 2016-05-01 ENCOUNTER — Ambulatory Visit (HOSPITAL_BASED_OUTPATIENT_CLINIC_OR_DEPARTMENT_OTHER): Payer: BLUE CROSS/BLUE SHIELD | Admitting: Oncology

## 2016-05-01 ENCOUNTER — Ambulatory Visit (HOSPITAL_BASED_OUTPATIENT_CLINIC_OR_DEPARTMENT_OTHER): Payer: BLUE CROSS/BLUE SHIELD

## 2016-05-01 VITALS — BP 111/73 | HR 56 | Temp 98.2°F | Resp 18 | Ht 74.0 in | Wt 207.9 lb

## 2016-05-01 DIAGNOSIS — C9210 Chronic myeloid leukemia, BCR/ABL-positive, not having achieved remission: Secondary | ICD-10-CM

## 2016-05-01 DIAGNOSIS — M25561 Pain in right knee: Secondary | ICD-10-CM

## 2016-05-01 DIAGNOSIS — M25562 Pain in left knee: Principal | ICD-10-CM

## 2016-05-01 LAB — CBC WITH DIFFERENTIAL/PLATELET
BASO%: 1.1 % (ref 0.0–2.0)
Basophils Absolute: 0.1 10*3/uL (ref 0.0–0.1)
EOS ABS: 0.5 10*3/uL (ref 0.0–0.5)
EOS%: 8.5 % — ABNORMAL HIGH (ref 0.0–7.0)
HEMATOCRIT: 40.2 % (ref 38.4–49.9)
HGB: 13.3 g/dL (ref 13.0–17.1)
LYMPH#: 1.4 10*3/uL (ref 0.9–3.3)
LYMPH%: 24.3 % (ref 14.0–49.0)
MCH: 31.2 pg (ref 27.2–33.4)
MCHC: 33.1 g/dL (ref 32.0–36.0)
MCV: 94.4 fL (ref 79.3–98.0)
MONO#: 0.6 10*3/uL (ref 0.1–0.9)
MONO%: 9.6 % (ref 0.0–14.0)
NEUT%: 56.5 % (ref 39.0–75.0)
NEUTROS ABS: 3.3 10*3/uL (ref 1.5–6.5)
PLATELETS: 184 10*3/uL (ref 140–400)
RBC: 4.25 10*6/uL (ref 4.20–5.82)
RDW: 14.2 % (ref 11.0–14.6)
WBC: 5.9 10*3/uL (ref 4.0–10.3)

## 2016-05-01 LAB — COMPREHENSIVE METABOLIC PANEL
ALBUMIN: 3.7 g/dL (ref 3.5–5.0)
ALK PHOS: 69 U/L (ref 40–150)
ALT: 17 U/L (ref 0–55)
ANION GAP: 8 meq/L (ref 3–11)
AST: 24 U/L (ref 5–34)
BILIRUBIN TOTAL: 0.66 mg/dL (ref 0.20–1.20)
BUN: 21.2 mg/dL (ref 7.0–26.0)
CALCIUM: 9 mg/dL (ref 8.4–10.4)
CHLORIDE: 107 meq/L (ref 98–109)
CO2: 25 mEq/L (ref 22–29)
CREATININE: 1.1 mg/dL (ref 0.7–1.3)
EGFR: 80 mL/min/{1.73_m2} — ABNORMAL LOW (ref >90)
Glucose: 98 mg/dl (ref 70–140)
Potassium: 4.5 mEq/L (ref 3.5–5.1)
Sodium: 140 mEq/L (ref 136–145)
Total Protein: 6.7 g/dL (ref 6.4–8.3)

## 2016-05-01 LAB — CHCC SMEAR

## 2016-05-01 NOTE — Telephone Encounter (Signed)
appt made and avs printed °

## 2016-05-01 NOTE — Progress Notes (Signed)
[place]  Telephone:(336) [phone] Fax:(336) [phone]     ID: [name] [name] DOB: July 25, 1966  MR#: [id number]  KHV#:[id number]  Patient Care Team: [name] Fess, MD as PCP - General (Family Medicine) [name] Cruel, MD as Consulting Physician (Oncology) [name] Moore, MD as Consulting Physician (Neurosurgery) [name] Marble, MD as Consulting Physician (Otolaryngology) [name] Irani, MD as Consulting Physician (Gastroenterology) [name] Booze, MD as Consulting Physician (Cardiology) PCP: [name] Pac, MD OTHER MD:  CHIEF COMPLAINT:  Chronic myeloid leukemia  CURRENT TREATMENT:  imatinib   HISTORY OF PRESENT ILLNESS: From the original intake note:  [name] [name] (the final "e" is pronounced) had a complete blood count as part of his annual physical 10/15/2014 and this showed a white cell count of 7.3, hemoglobin 13.9, and platelets under 96,000. The differential was unremarkable.    more recently, as preop testing for anticipated back surgery he had a repeat CBC yesterday, 08/24/2015. This showed a white cell count of 72.9 thousand.  Hemoglobin and platelet count as well as creatinine were reported as normal. No differential was done. The patient was referred to Dr. Rex [name] who today repeated the CBC, finding a white cell count of 78.2 thousand, hemoglobin 14.2, and platelets 243,000. He requested we evaluate the patient urgently.    [name] [name]'s subsequent history is as detailed below   INTERVAL HISTORY: [name] [name] returns today for follow-up of his chronic myeloid leukemia accompanied by his wife [name] [name].. He continues on imatinib, but we opt his dose late July to 600 mg daily. He is tolerating this well but he feels it makes some a little bit more tired than he might otherwise be. He is having occasional loose bowel movements. He is not requiring Imodium. He has not had any skin rash or other side effects that he is aware of. Of course she obtains a drug at no  cost.  REVIEW OF SYSTEMS: [name] [name] runs about 3 times a week, up to 4 miles, and then lifts weights 2 or 3 days a week. He says work is doing fine. He falls asleep easily but wakes up easily as well. A detailed review of systems today was otherwise noncontributory.  PAST MEDICAL HISTORY: Past Medical History:  Diagnosis Date  . Arthritis   . 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 Depression   . GERD (gastroesophageal reflux disease)   . Hyperlipidemia   . No pertinent past medical history   . OSA (obstructive sleep apnea)    mild so patient did not want to be on CPAP  . Sinus disorder     PAST SURGICAL HISTORY: Past Surgical History:  Procedure Laterality Date  . CARDIAC CATHETERIZATION  99,04   both normal, chest pain felt secondary to coronary vasospasm  . HAND / FINGER LESION EXCISION  2009   fx lt metacarpal  . NASAL SEPTOPLASTY W/ TURBINOPLASTY  08/13/2011   Procedure: NASAL SEPTOPLASTY WITH TURBINATE REDUCTION;  Surgeon: [name] [name];  Location: [facility];  Service: ENT;  Laterality: N/A;  with LAUP    FAMILY HISTORY No family history on file.  the patient's father is living, age 36. He had 2 sisters, both with breast cancer diagnosed in their 24s and 7s. The patient's mother died from breast cancer at the age of 76 , it was diagnosed age 56. The patient has one brother and one sister, both in good health. There is no history of hematologic malignancies  in the family.  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  He has a child from a first marriage, [name] [name], who lives in [place] is planning to become a Secretary/administrator. He has been married 12 years to [name] [name] , and they have a daughter, [name] [name] , 37, at home. The patient attends the [employer] in [place]:  Not in place   HEALTH MAINTENANCE: Social History  Substance  Use Topics  . Smoking status: Former Research scientist (life sciences)  . Smokeless tobacco: Not on file  . Alcohol use Yes     Colonoscopy: 2010/ Amedeo Plenty  PSA:  Bone density:  Lipid panel:  No Known Allergies  Current Outpatient Prescriptions  Medication Sig Dispense Refill  . aspirin 81 MG tablet Take 81 mg by mouth daily.    . diphenhydrAMINE (BENADRYL) 25 MG tablet Take 25 mg by mouth every 6 (six) hours as needed for sleep.    Marland Kitchen gabapentin (NEURONTIN) 300 MG capsule Take 1 capsule (300 mg total) by mouth at bedtime. 90 capsule 4  . imatinib (GLEEVEC) 100 MG tablet Take 2 tablets (200 mg total) by mouth daily. Take with meals and large glass of water.Caution:Chemotherapy 180 tablet 3  . imatinib (GLEEVEC) 400 MG tablet Take 1 tablet (400 mg total) by mouth daily. Take with meals and large glass of water.Caution:Chemotherapy. 90 tablet 4  . loperamide (IMODIUM) 2 MG capsule Take 1 capsule (2 mg total) by mouth as needed for diarrhea or loose stools. 30 capsule 4  . omeprazole (PRILOSEC) 20 MG capsule Take 20 mg by mouth daily.       No current facility-administered medications for this visit.     OBJECTIVE:  Middle-aged white man Who appears stated age 50:   05/01/16 0934  BP: 111/73  Pulse: (!) 56  Resp: 18  Temp: 98.2 F (36.8 C)     Body mass index is 26.69 kg/m.    ECOG FS:0 - Asymptomatic  Sclerae unicteric, EOMs intact Oropharynx clear and moist No cervical or supraclavicular adenopathy Lungs no rales or rhonchi Heart regular rate and rhythm Abd soft, nontender, positive bowel sounds MSK no focal spinal tenderness Neuro: nonfocal, well oriented, appropriate affect     LAB RESULTS: CMP     Component Value Date/Time   NA 142 04/10/2016 0755   K 3.9 04/10/2016 0755   CO2 26 04/10/2016 0755   GLUCOSE 75 04/10/2016 0755   BUN 19.8 04/10/2016 0755   CREATININE 1.2 04/10/2016 0755   CALCIUM 9.1 04/10/2016 0755   PROT 6.7 04/10/2016 0755   ALBUMIN 3.9 04/10/2016 0755   AST 25  04/10/2016 0755   ALT 22 04/10/2016 0755   ALKPHOS 66 04/10/2016 0755   BILITOT 0.79 04/10/2016 0755    INo results found for: SPEP, UPEP  Lab Results  Component Value Date   WBC 5.5 04/10/2016   NEUTROABS 2.9 04/10/2016   HGB 13.5 04/10/2016   HCT 40.1 04/10/2016   MCV 92.8 04/10/2016   PLT 181 04/10/2016      Chemistry      Component Value Date/Time   NA 142 04/10/2016 0755   K 3.9 04/10/2016 0755   CO2 26 04/10/2016 0755   BUN 19.8 04/10/2016 0755   CREATININE 1.2 04/10/2016 0755      Component Value Date/Time   CALCIUM 9.1 04/10/2016 0755   ALKPHOS 66 04/10/2016 0755   AST 25 04/10/2016 0755   ALT 22  04/10/2016 0755   BILITOT 0.79 04/10/2016 0755       No results found for: LABCA2  No components found for: [id number]  No results for input(s): INR in the last 168 hours.  Urinalysis No results found for: COLORURINE, APPEARANCEUR, LABSPEC, PHURINE, GLUCOSEU, HGBUR, BILIRUBINUR, KETONESUR, PROTEINUR, UROBILINOGEN, NITRITE, LEUKOCYTESUR  STUDIES: No results found.GROSS AND MICROSCOPIC INFORMATION  ASSESSMENT: 50 y.o. [place] man presenting 08/25/2015 with a white cell count of greater than 70,000, primarily myeloid cells, in the absence of any symptoms or signs of infection, with no splenomegaly by exam, basophils 4% and with no blasts seen on the peripheral smear, normal hemoglobin and platelet count  (a) Sokal score 0.64 (low risk)  (b) bone marrow biopsy 09/01/2015 shows 0% blasts  (c) electrocardiogram 11/24/2013 shows a QT/QTc of 424/422  (1) imatinib 400 mg/d started 09/06/2015  (2) bcr/abl1 baseline 08/26/2015 = 116.73%, with WBC 43.7  (a) first month reading ratio 46% with complete hematologic response  (b) second month reading 56% with continuing hematologic response  (c) third month reading 40%   (i) no ABL mutation identified March 2017  (d) fourth month reading 10.9%  (e) imatinib dose increased to 600 mg/day as of 04/02/2016  (f) bcr.abl  6.7% on 04/10/2016    PLAN: [name] [name] is tolerating the higher dose of imatinib generally well. He would be willing to continue this indefinitely so long as it gets is where we wanted to get [place].  We are going to repeat a BCR ABL today and again September 27. He is going to see me again mid October. We really do need to be closer to a deep molecular response at that point otherwise we will switched to dasatinib.  [name] [name] has a good understanding of this plan. She agrees with it. He knows to call for any problems that may develop before his next visit here.   [name] Cruel, MD   05/01/2016 9:37 AM Medical Oncology and Hematology [facility] [address] [place], [place] [address] Tel. [phone]    Fax. [phone]

## 2016-05-30 ENCOUNTER — Other Ambulatory Visit (HOSPITAL_BASED_OUTPATIENT_CLINIC_OR_DEPARTMENT_OTHER): Payer: BLUE CROSS/BLUE SHIELD

## 2016-05-30 DIAGNOSIS — C9210 Chronic myeloid leukemia, BCR/ABL-positive, not having achieved remission: Secondary | ICD-10-CM

## 2016-05-30 DIAGNOSIS — M25561 Pain in right knee: Secondary | ICD-10-CM

## 2016-05-30 DIAGNOSIS — M25562 Pain in left knee: Principal | ICD-10-CM

## 2016-05-30 LAB — COMPREHENSIVE METABOLIC PANEL
ALBUMIN: 3.5 g/dL (ref 3.5–5.0)
ALT: 24 U/L (ref 0–55)
AST: 25 U/L (ref 5–34)
Alkaline Phosphatase: 79 U/L (ref 40–150)
Anion Gap: 7 mEq/L (ref 3–11)
BUN: 19.6 mg/dL (ref 7.0–26.0)
CHLORIDE: 107 meq/L (ref 98–109)
CO2: 25 meq/L (ref 22–29)
Calcium: 9 mg/dL (ref 8.4–10.4)
Creatinine: 1.2 mg/dL (ref 0.7–1.3)
EGFR: 70 mL/min/{1.73_m2} — ABNORMAL LOW (ref >90)
GLUCOSE: 85 mg/dL (ref 70–140)
POTASSIUM: 4.6 meq/L (ref 3.5–5.1)
SODIUM: 140 meq/L (ref 136–145)
Total Bilirubin: 0.57 mg/dL (ref 0.20–1.20)
Total Protein: 6.4 g/dL (ref 6.4–8.3)

## 2016-05-30 LAB — CBC WITH DIFFERENTIAL/PLATELET
BASO%: 0.8 % (ref 0.0–2.0)
Basophils Absolute: 0 10*3/uL (ref 0.0–0.1)
EOS%: 9.1 % — AB (ref 0.0–7.0)
Eosinophils Absolute: 0.5 10*3/uL (ref 0.0–0.5)
HCT: 38 % — ABNORMAL LOW (ref 38.4–49.9)
HGB: 12.7 g/dL — ABNORMAL LOW (ref 13.0–17.1)
LYMPH#: 1.1 10*3/uL (ref 0.9–3.3)
LYMPH%: 22.9 % (ref 14.0–49.0)
MCH: 31.6 pg (ref 27.2–33.4)
MCHC: 33.4 g/dL (ref 32.0–36.0)
MCV: 94.5 fL (ref 79.3–98.0)
MONO#: 0.4 10*3/uL (ref 0.1–0.9)
MONO%: 7.3 % (ref 0.0–14.0)
NEUT#: 3 10*3/uL (ref 1.5–6.5)
NEUT%: 59.9 % (ref 39.0–75.0)
Platelets: 172 10*3/uL (ref 140–400)
RBC: 4.02 10*6/uL — AB (ref 4.20–5.82)
RDW: 14.7 % — ABNORMAL HIGH (ref 11.0–14.6)
WBC: 4.9 10*3/uL (ref 4.0–10.3)

## 2016-05-30 LAB — CHCC SMEAR

## 2016-06-09 ENCOUNTER — Other Ambulatory Visit: Payer: Self-pay | Admitting: Oncology

## 2016-06-09 NOTE — Progress Notes (Signed)
Kastiel sought a second opinion from [facility] at [facility]. Make sure to add him to the care team.

## 2016-06-20 ENCOUNTER — Ambulatory Visit: Payer: BLUE CROSS/BLUE SHIELD | Admitting: Oncology

## 2016-06-28 ENCOUNTER — Telehealth: Payer: Self-pay | Admitting: *Deleted

## 2016-06-28 ENCOUNTER — Other Ambulatory Visit (HOSPITAL_BASED_OUTPATIENT_CLINIC_OR_DEPARTMENT_OTHER): Payer: BLUE CROSS/BLUE SHIELD

## 2016-06-28 DIAGNOSIS — C9210 Chronic myeloid leukemia, BCR/ABL-positive, not having achieved remission: Secondary | ICD-10-CM | POA: Diagnosis not present

## 2016-06-28 DIAGNOSIS — M25562 Pain in left knee: Principal | ICD-10-CM

## 2016-06-28 DIAGNOSIS — M25561 Pain in right knee: Secondary | ICD-10-CM

## 2016-06-28 LAB — COMPREHENSIVE METABOLIC PANEL
ALBUMIN: 3.6 g/dL (ref 3.5–5.0)
ALK PHOS: 76 U/L (ref 40–150)
ALT: 17 U/L (ref 0–55)
AST: 20 U/L (ref 5–34)
Anion Gap: 8 mEq/L (ref 3–11)
BUN: 16.1 mg/dL (ref 7.0–26.0)
CHLORIDE: 109 meq/L (ref 98–109)
CO2: 25 mEq/L (ref 22–29)
Calcium: 9.1 mg/dL (ref 8.4–10.4)
Creatinine: 1.2 mg/dL (ref 0.7–1.3)
EGFR: 72 mL/min/{1.73_m2} — ABNORMAL LOW (ref >90)
GLUCOSE: 98 mg/dL (ref 70–140)
Potassium: 4.4 mEq/L (ref 3.5–5.1)
SODIUM: 141 meq/L (ref 136–145)
Total Bilirubin: 0.43 mg/dL (ref 0.20–1.20)
Total Protein: 6.9 g/dL (ref 6.4–8.3)

## 2016-06-28 LAB — CBC WITH DIFFERENTIAL/PLATELET
BASO%: 0.2 % (ref 0.0–2.0)
Basophils Absolute: 0 10*3/uL (ref 0.0–0.1)
EOS%: 2.6 % (ref 0.0–7.0)
Eosinophils Absolute: 0.2 10*3/uL (ref 0.0–0.5)
HEMATOCRIT: 40.4 % (ref 38.4–49.9)
HEMOGLOBIN: 13.3 g/dL (ref 13.0–17.1)
LYMPH#: 0.6 10*3/uL — AB (ref 0.9–3.3)
LYMPH%: 10 % — ABNORMAL LOW (ref 14.0–49.0)
MCH: 32 pg (ref 27.2–33.4)
MCHC: 32.9 g/dL (ref 32.0–36.0)
MCV: 97.3 fL (ref 79.3–98.0)
MONO#: 0.5 10*3/uL (ref 0.1–0.9)
MONO%: 9 % (ref 0.0–14.0)
NEUT#: 4.5 10*3/uL (ref 1.5–6.5)
NEUT%: 78.2 % — AB (ref 39.0–75.0)
Platelets: 165 10*3/uL (ref 140–400)
RBC: 4.15 10*6/uL — ABNORMAL LOW (ref 4.20–5.82)
RDW: 15.4 % — AB (ref 11.0–14.6)
WBC: 5.7 10*3/uL (ref 4.0–10.3)

## 2016-06-28 LAB — CHCC SMEAR

## 2016-06-28 NOTE — Telephone Encounter (Signed)
Faxed patient enrollment request-CML Treatment Assessment program to [phone].

## 2016-07-06 ENCOUNTER — Other Ambulatory Visit: Payer: Self-pay | Admitting: Oncology

## 2016-07-06 NOTE — Progress Notes (Signed)
I called Coreg and gave him the news of his most recent lab results, which are favorable. I am hopeful that he will be at 0.1 or below with a late November check area recall that we started this January 2017 so we still have 2 months to get to that major molecular response. (also copied Clint Bolder at [facility])

## 2016-07-24 ENCOUNTER — Telehealth: Payer: Self-pay | Admitting: *Deleted

## 2016-07-24 NOTE — Telephone Encounter (Signed)
"  [facility] calling for clarification of Gleevec order.  We're processing order for 400 mg but in his file see an order for 100 mg, take two daily.  Is he taking both or does he just need 400 mg."   He needs both the 400 mg and two 100 mg to = 600 mg daily.  Dose was increased July 28th 2017 per collabortive note with refill order.

## 2016-07-30 ENCOUNTER — Other Ambulatory Visit: Payer: BLUE CROSS/BLUE SHIELD

## 2016-07-31 ENCOUNTER — Telehealth: Payer: Self-pay | Admitting: Oncology

## 2016-07-31 NOTE — Telephone Encounter (Signed)
Appointment rescheduled per patient request. °

## 2016-08-02 ENCOUNTER — Telehealth: Payer: Self-pay | Admitting: Oncology

## 2016-08-02 ENCOUNTER — Other Ambulatory Visit: Payer: BLUE CROSS/BLUE SHIELD

## 2016-08-02 NOTE — Telephone Encounter (Signed)
Lab appointment scheduled per patient request. 08/02/16

## 2016-08-03 ENCOUNTER — Other Ambulatory Visit (HOSPITAL_BASED_OUTPATIENT_CLINIC_OR_DEPARTMENT_OTHER): Payer: BLUE CROSS/BLUE SHIELD

## 2016-08-03 DIAGNOSIS — C9210 Chronic myeloid leukemia, BCR/ABL-positive, not having achieved remission: Secondary | ICD-10-CM | POA: Diagnosis not present

## 2016-08-03 DIAGNOSIS — M25561 Pain in right knee: Secondary | ICD-10-CM

## 2016-08-03 DIAGNOSIS — M25562 Pain in left knee: Principal | ICD-10-CM

## 2016-08-03 LAB — CHCC SMEAR

## 2016-08-03 LAB — COMPREHENSIVE METABOLIC PANEL
ALT: 45 U/L (ref 0–55)
AST: 32 U/L (ref 5–34)
Albumin: 3.8 g/dL (ref 3.5–5.0)
Alkaline Phosphatase: 84 U/L (ref 40–150)
Anion Gap: 8 mEq/L (ref 3–11)
BUN: 23 mg/dL (ref 7.0–26.0)
CHLORIDE: 108 meq/L (ref 98–109)
CO2: 25 meq/L (ref 22–29)
Calcium: 9.1 mg/dL (ref 8.4–10.4)
Creatinine: 1.1 mg/dL (ref 0.7–1.3)
EGFR: 75 mL/min/{1.73_m2} — ABNORMAL LOW (ref >90)
GLUCOSE: 85 mg/dL (ref 70–140)
POTASSIUM: 4.1 meq/L (ref 3.5–5.1)
SODIUM: 141 meq/L (ref 136–145)
Total Bilirubin: 0.74 mg/dL (ref 0.20–1.20)
Total Protein: 6.8 g/dL (ref 6.4–8.3)

## 2016-08-03 LAB — CBC WITH DIFFERENTIAL/PLATELET
BASO%: 0.9 % (ref 0.0–2.0)
Basophils Absolute: 0 10*3/uL (ref 0.0–0.1)
EOS%: 9.2 % — AB (ref 0.0–7.0)
Eosinophils Absolute: 0.5 10*3/uL (ref 0.0–0.5)
HCT: 38.1 % — ABNORMAL LOW (ref 38.4–49.9)
HGB: 12.4 g/dL — ABNORMAL LOW (ref 13.0–17.1)
LYMPH#: 1 10*3/uL (ref 0.9–3.3)
LYMPH%: 20.3 % (ref 14.0–49.0)
MCH: 31.9 pg (ref 27.2–33.4)
MCHC: 32.6 g/dL (ref 32.0–36.0)
MCV: 97.9 fL (ref 79.3–98.0)
MONO#: 0.4 10*3/uL (ref 0.1–0.9)
MONO%: 8.6 % (ref 0.0–14.0)
NEUT%: 61 % (ref 39.0–75.0)
NEUTROS ABS: 3.1 10*3/uL (ref 1.5–6.5)
Platelets: 163 10*3/uL (ref 140–400)
RBC: 3.89 10*6/uL — AB (ref 4.20–5.82)
RDW: 14.9 % — ABNORMAL HIGH (ref 11.0–14.6)
WBC: 5.1 10*3/uL (ref 4.0–10.3)

## 2016-08-09 ENCOUNTER — Other Ambulatory Visit: Payer: Self-pay | Admitting: Oncology

## 2016-08-10 ENCOUNTER — Other Ambulatory Visit: Payer: Self-pay | Admitting: Oncology

## 2016-08-10 NOTE — Progress Notes (Unsigned)
I discussed the most recent results with [name] [name] and with Dr. Royce [name] in [facility]. Dr. Luis [name] thought is that we should repeat it next month and if clearly rising then repeat the ABL kinase mutation testing. If there is no T3-1-5 mutation he would put him on nilotinib, which is also made by [employer].  I called [name] [name] and discussed stopping the imatinib further. We are going to start 800 mg a day today. He has enough pills in hand so that we don't have to put in a new prescription. We are canceling his appointment with me this month since we just talked about all this today. We will check his lab work again the first week in January and then he will see me January 18 or so (he will be out of town after January 20) to discuss results.

## 2016-08-15 ENCOUNTER — Telehealth: Payer: Self-pay | Admitting: Oncology

## 2016-08-15 NOTE — Telephone Encounter (Signed)
lvm to inform pt of lab/ov appt date/times per LOS

## 2016-08-20 ENCOUNTER — Ambulatory Visit: Payer: BLUE CROSS/BLUE SHIELD | Admitting: Oncology

## 2016-08-21 ENCOUNTER — Telehealth: Payer: Self-pay | Admitting: Oncology

## 2016-08-21 NOTE — Telephone Encounter (Signed)
1/22 Appointment rescheduled per patient request to 1/15 per availability of the patient.

## 2016-08-29 ENCOUNTER — Telehealth: Payer: Self-pay | Admitting: Oncology

## 2016-08-29 NOTE — Telephone Encounter (Signed)
Faxed records to [facility] (2/17-09/17)

## 2016-09-04 ENCOUNTER — Other Ambulatory Visit: Payer: Self-pay | Admitting: *Deleted

## 2016-09-04 DIAGNOSIS — C9210 Chronic myeloid leukemia, BCR/ABL-positive, not having achieved remission: Secondary | ICD-10-CM

## 2016-09-05 ENCOUNTER — Other Ambulatory Visit (HOSPITAL_BASED_OUTPATIENT_CLINIC_OR_DEPARTMENT_OTHER): Payer: BLUE CROSS/BLUE SHIELD

## 2016-09-05 DIAGNOSIS — C9210 Chronic myeloid leukemia, BCR/ABL-positive, not having achieved remission: Secondary | ICD-10-CM

## 2016-09-05 LAB — CBC WITH DIFFERENTIAL/PLATELET
BASO%: 0.8 % (ref 0.0–2.0)
Basophils Absolute: 0 10*3/uL (ref 0.0–0.1)
EOS%: 7.6 % — AB (ref 0.0–7.0)
Eosinophils Absolute: 0.4 10*3/uL (ref 0.0–0.5)
HEMATOCRIT: 39.2 % (ref 38.4–49.9)
HEMOGLOBIN: 13 g/dL (ref 13.0–17.1)
LYMPH#: 1 10*3/uL (ref 0.9–3.3)
LYMPH%: 20.4 % (ref 14.0–49.0)
MCH: 32.4 pg (ref 27.2–33.4)
MCHC: 33.3 g/dL (ref 32.0–36.0)
MCV: 97.4 fL (ref 79.3–98.0)
MONO#: 0.5 10*3/uL (ref 0.1–0.9)
MONO%: 8.9 % (ref 0.0–14.0)
NEUT#: 3.2 10*3/uL (ref 1.5–6.5)
NEUT%: 62.3 % (ref 39.0–75.0)
Platelets: 151 10*3/uL (ref 140–400)
RBC: 4.02 10*6/uL — ABNORMAL LOW (ref 4.20–5.82)
RDW: 14.3 % (ref 11.0–14.6)
WBC: 5.1 10*3/uL (ref 4.0–10.3)

## 2016-09-05 LAB — COMPREHENSIVE METABOLIC PANEL
ALBUMIN: 3.8 g/dL (ref 3.5–5.0)
ALK PHOS: 82 U/L (ref 40–150)
ALT: 17 U/L (ref 0–55)
AST: 22 U/L (ref 5–34)
Anion Gap: 8 mEq/L (ref 3–11)
BUN: 21.1 mg/dL (ref 7.0–26.0)
CHLORIDE: 105 meq/L (ref 98–109)
CO2: 26 mEq/L (ref 22–29)
CREATININE: 1.1 mg/dL (ref 0.7–1.3)
Calcium: 9 mg/dL (ref 8.4–10.4)
EGFR: 73 mL/min/{1.73_m2} — ABNORMAL LOW (ref >90)
GLUCOSE: 85 mg/dL (ref 70–140)
Potassium: 3.9 mEq/L (ref 3.5–5.1)
SODIUM: 139 meq/L (ref 136–145)
Total Bilirubin: 0.69 mg/dL (ref 0.20–1.20)
Total Protein: 6.5 g/dL (ref 6.4–8.3)

## 2016-09-17 ENCOUNTER — Ambulatory Visit: Payer: BLUE CROSS/BLUE SHIELD | Admitting: Oncology

## 2016-09-17 NOTE — Progress Notes (Signed)
I called [name] [name] to let him know we did not have the results of this BCR.ABL result lab work yet. I will reschedule his appointment to Friday of this week. He will be out of town next week.

## 2016-09-21 ENCOUNTER — Ambulatory Visit (HOSPITAL_BASED_OUTPATIENT_CLINIC_OR_DEPARTMENT_OTHER): Payer: BLUE CROSS/BLUE SHIELD | Admitting: Oncology

## 2016-09-21 ENCOUNTER — Other Ambulatory Visit: Payer: Self-pay | Admitting: *Deleted

## 2016-09-21 VITALS — BP 118/46 | HR 55 | Temp 98.3°F | Resp 17 | Ht 74.0 in | Wt 214.8 lb

## 2016-09-21 DIAGNOSIS — C9210 Chronic myeloid leukemia, BCR/ABL-positive, not having achieved remission: Secondary | ICD-10-CM

## 2016-09-21 MED ORDER — IMATINIB MESYLATE 400 MG PO TABS: 400.0000 mg | ORAL_TABLET | Freq: Two times a day (BID) | ORAL | 4 refills | Status: DC

## 2016-09-22 NOTE — Progress Notes (Signed)
[place]  Telephone:(336) [phone] Fax:(336) [phone]     ID: DUY LEMMING DOB: 10-28-1965  MR#: [id number]  JTT#:[id number]  Patient Care Team: Hulan Fess, [name], [name], [name], [name], [name], MD as Consulting Physician (Cardiology) Gloriann Loan, MD as Attending Physician PCP: Gennette Pac, MD OTHER MD:  CHIEF COMPLAINT:  Chronic myeloid leukemia  CURRENT TREATMENT:  imatinib   HISTORY OF PRESENT ILLNESS: From the original intake note:  [name] (the final "e" is pronounced) had a complete blood count as part of his annual physical 10/15/2014 and this showed a white cell count of 7.3, hemoglobin 13.9, and platelets under 96,000. The differential was unremarkable.    more recently, as preop testing for anticipated back surgery he had a repeat CBC yesterday, 08/24/2015. This showed a white cell count of 72.9 thousand.  Hemoglobin and platelet count as well as creatinine were reported as normal. No differential was done. The patient was referred to Dr. Rex Kras who today repeated the CBC, finding a white cell count of 78.2 thousand, hemoglobin 14.2, and platelets 243,000. He requested we evaluate the patient urgently.    Ilario's subsequent history is as detailed below   INTERVAL HISTORY: Santana returns today for follow-up of his chronic myeloid leukemia accompanied by his daughter, who was out of school because of the snow today. We have been following his BCR.ABL percentage monthly and it was trending down nicely, with the late October reading (10 months into imatinib treatment) down to 0.80 (ISR 0.664) percent. However the December one reading was up to 0.90 (ISR 2.07) percent and when I got the results  of 08/10/2016 I called Cecilie Lowers and we opt the imatinib to 800 mg daily.  We just obtained the repeat reading as of 09/05/2016 and this showed the percentage to be decreased to 0.25 (ISR 0.575) percent   REVIEW OF SYSTEMS: [name] is having more side effects at the current dose of imatinib. He is having a little bit of nausea. He is taking both pills at the same time instead of taking one pill twice a day. When this became apparent and we changed to twice a day the nausea essentially resolved. He is having more leg cramps. This doesn't occur all the time but they do get in the way of his running. He is taking additional fluids and and "electrolyte pill" to work on that. He has had no rash. He does have loose bowel movements once or twice a day but these don't interfere with his daily activities and he rarely requires Imodium. Aside from these issues a detailed review of systems today was noncontributory   PAST MEDICAL HISTORY: Past Medical History:  Diagnosis Date  . Arthritis   . 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 Depression   . GERD (gastroesophageal reflux disease)   . Hyperlipidemia   . No pertinent past medical history   . OSA (obstructive sleep apnea)    mild so patient did not want to be on CPAP  . Sinus disorder     PAST SURGICAL HISTORY: Past Surgical History:  Procedure Laterality Date  . CARDIAC CATHETERIZATION  99,04   both normal, chest pain felt secondary to coronary vasospasm  . HAND / FINGER LESION EXCISION  2009   fx lt metacarpal  . NASAL SEPTOPLASTY W/ TURBINOPLASTY  08/13/2011   Procedure: NASAL SEPTOPLASTY WITH TURBINATE REDUCTION;  Surgeon: Tyson Alias;  Location: [facility];  Service: ENT;  Laterality: N/A;  with LAUP    FAMILY HISTORY No family history on file.  the patient's father is living, age 41. He had 2 sisters, both with breast cancer diagnosed in their 15s and 60s. The patient's  mother died from breast cancer at the age of 40 , it was diagnosed age 68. The patient has one brother and one sister, both in good health. There is no history of hematologic malignancies in the family.  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  He has a child from a first marriage, [name], who lives in [place] is planning to become a Secretary/administrator. He has been married 12 years to [name] , and they have a daughter, [name] , 26, at home. The patient attends the [employer] in [place]:  Not in place   HEALTH MAINTENANCE: Social History  Substance Use Topics  . Smoking status: Former Research scientist (life sciences)  . Smokeless tobacco: Not on file  . Alcohol use Yes     Colonoscopy: 2010/ Amedeo Plenty  PSA:  Bone density:  Lipid panel:  No Known Allergies  Current Outpatient Prescriptions  Medication Sig Dispense Refill  . aspirin 81 MG tablet Take 81 mg by mouth daily.    . diphenhydrAMINE (BENADRYL) 25 MG tablet Take 25 mg by mouth every 6 (six) hours as needed for sleep.    Marland Kitchen gabapentin (NEURONTIN) 300 MG capsule Take 1 capsule (300 mg total) by mouth at bedtime. 90 capsule 4  . imatinib (GLEEVEC) 400 MG tablet Take 1 tablet (400 mg total) by mouth 2 (two) times daily. Take with meals and large glass of water.Caution:Chemotherapy. 180 tablet 4  . loperamide (IMODIUM) 2 MG capsule Take 1 capsule (2 mg total) by mouth as needed for diarrhea or loose stools. 30 capsule 4  . omeprazole (PRILOSEC) 20 MG capsule Take 20 mg by mouth daily.       No current facility-administered medications for this visit.     OBJECTIVE:  Middle-aged white man  Who appears well  Vitals:   09/21/16 0924  BP: (!) 118/46  Pulse: (!) 55  Resp: 17  Temp: 98.3 F (36.8 C)     Body mass index is 27.58 kg/m.    ECOG FS:0 - Asymptomatic  Sclerae unicteric, pupils round and equal Oropharynx clear and moist-- no thrush or other lesions No  cervical or supraclavicular adenopathy Lungs no rales or rhonchi Heart regular rate and rhythm Abd soft, nontender, positive bowel sounds MSK no focal spinal tenderness, no upper extremity lymphedema Neuro: nonfocal, well oriented, appropriate affect Skin: No evidence of rash  LAB RESULTS: CMP     Component Value Date/Time   NA 139 09/05/2016 0828   K 3.9 09/05/2016 0828   CO2 26 09/05/2016 0828   GLUCOSE 85 09/05/2016 0828   BUN 21.1 09/05/2016 0828   CREATININE 1.1 09/05/2016 0828   CALCIUM 9.0 09/05/2016 0828   PROT 6.5 09/05/2016 0828   ALBUMIN 3.8 09/05/2016 0828   AST 22 09/05/2016 0828   ALT 17 09/05/2016 0828   ALKPHOS 82 09/05/2016 0828   BILITOT 0.69 09/05/2016 0828    INo results found for: SPEP, UPEP  Lab Results  Component Value Date   WBC  5.1 09/05/2016   NEUTROABS 3.2 09/05/2016   HGB 13.0 09/05/2016   HCT 39.2 09/05/2016   MCV 97.4 09/05/2016   PLT 151 09/05/2016      Chemistry      Component Value Date/Time   NA 139 09/05/2016 0828   K 3.9 09/05/2016 0828   CO2 26 09/05/2016 0828   BUN 21.1 09/05/2016 0828   CREATININE 1.1 09/05/2016 0828      Component Value Date/Time   CALCIUM 9.0 09/05/2016 0828   ALKPHOS 82 09/05/2016 0828   AST 22 09/05/2016 0828   ALT 17 09/05/2016 0828   BILITOT 0.69 09/05/2016 0828       No results found for: LABCA2  No components found for: LABCA125  No results for input(s): INR in the last 168 hours.  Urinalysis No results found for: COLORURINE, APPEARANCEUR, LABSPEC, PHURINE, GLUCOSEU, HGBUR, BILIRUBINUR, KETONESUR, PROTEINUR, UROBILINOGEN, NITRITE, LEUKOCYTESUR  STUDIES: No results found.GROSS AND MICROSCOPIC INFORMATION  ASSESSMENT: 51 y.o. [place] man presenting 08/25/2015 with a white cell count of greater than 70,000, primarily myeloid cells, in the absence of any symptoms or signs of infection, with no splenomegaly by exam, basophils 4% and with no blasts seen on the peripheral smear, normal  hemoglobin and platelet count  (a) Sokal score 0.64 (low risk)  (b) bone marrow biopsy 09/01/2015 shows 0% blasts  (c) electrocardiogram 11/24/2013 shows a QT/QTc of 424/422  (1) imatinib 400 mg/d started 09/06/2015    (2) bcr/abl1 baseline 08/26/2015 = 116.73%, with WBC 43.7  (a) first month reading ratio 46% with complete hematologic response  (b) second month reading 56% with continuing hematologic response  (c) third month reading 40%   (i) no ABL mutation identified March 2017  (d) fourth month reading 10.9%  (e) imatinib dose increased to 600 mg/day as of 04/02/2016  (f) bcr.abl 6.7% on 04/10/2016  (g) further downward trend interrupted December 2017 with slight rise in bcr.abl %   (h) imatinib dose increased to 400 mg po BID as of 08/10/2016  (i) 09/05/2016 reading 0.25% (this is the one year mark)    PLAN: Rayshun has been on imatinib one year. He is not quite at major molecular response by BCR ABL, but he is close and trending down with the higher dose of imatinib. He is tolerating this generally well except for problems with cramps and slight diarrhea.  Today we discussed switching to nilotinib/Tasigna. This of course also is made by [employer] and therefore he would have easy access to it. However the side effect profile is about the same, the administration is more complex (on an empty stomach rather than with a meal) and we would need to monitor his EKG for a QT prolongation (his baseline is fine of course).   After much discussion we decided to continue the current dose of imatinib another 2 months and see how low it takes the BCR ABL percentage. If it gets as to a major molecular response on this basis we would continue a minimum of 6 months before considering dialing down the imatinib back to 600 mg daily, which was slightly better tolerated. If we don't get much further, we will consider giving nilotinib try.  Dr. Royce Macadamia from [facility] suggested if we are unclear about any of this  we should consider a bone marrow biopsy to show that he is in cytogenetic remission which is a better validated in point then MMR 0.1%. [name] is hoping not to have to go through that procedure again but he  would be willing to if necessary.  At this point I have set him up for repeat lab work February 6 and every 4 weeks into April and he will see me mid April to discuss results.  Joab knows to call for any problems that may develop before his next visit here.  Chauncey Cruel, MD   09/22/2016 10:24 AM Medical Oncology and Hematology [facility] [address] [place], [place] [address] Tel. [phone]    Fax. [phone]

## 2016-09-24 ENCOUNTER — Ambulatory Visit: Payer: BLUE CROSS/BLUE SHIELD | Admitting: Oncology

## 2016-10-09 ENCOUNTER — Other Ambulatory Visit (HOSPITAL_BASED_OUTPATIENT_CLINIC_OR_DEPARTMENT_OTHER): Payer: BLUE CROSS/BLUE SHIELD

## 2016-10-09 DIAGNOSIS — C9210 Chronic myeloid leukemia, BCR/ABL-positive, not having achieved remission: Secondary | ICD-10-CM

## 2016-10-09 LAB — COMPREHENSIVE METABOLIC PANEL
ALBUMIN: 4 g/dL (ref 3.5–5.0)
ALK PHOS: 68 U/L (ref 40–150)
ALT: 22 U/L (ref 0–55)
AST: 30 U/L (ref 5–34)
Anion Gap: 7 mEq/L (ref 3–11)
BUN: 20 mg/dL (ref 7.0–26.0)
CO2: 28 mEq/L (ref 22–29)
Calcium: 9 mg/dL (ref 8.4–10.4)
Chloride: 106 mEq/L (ref 98–109)
Creatinine: 1.2 mg/dL (ref 0.7–1.3)
EGFR: 70 mL/min/{1.73_m2} — ABNORMAL LOW (ref >90)
GLUCOSE: 72 mg/dL (ref 70–140)
POTASSIUM: 4 meq/L (ref 3.5–5.1)
SODIUM: 141 meq/L (ref 136–145)
TOTAL PROTEIN: 6.6 g/dL (ref 6.4–8.3)
Total Bilirubin: 0.88 mg/dL (ref 0.20–1.20)

## 2016-10-09 LAB — CBC WITH DIFFERENTIAL/PLATELET
BASO%: 1 % (ref 0.0–2.0)
BASOS ABS: 0 10*3/uL (ref 0.0–0.1)
EOS ABS: 0.3 10*3/uL (ref 0.0–0.5)
EOS%: 7 % (ref 0.0–7.0)
HCT: 38.6 % (ref 38.4–49.9)
HGB: 12.9 g/dL — ABNORMAL LOW (ref 13.0–17.1)
LYMPH%: 23.1 % (ref 14.0–49.0)
MCH: 32.7 pg (ref 27.2–33.4)
MCHC: 33.4 g/dL (ref 32.0–36.0)
MCV: 98 fL (ref 79.3–98.0)
MONO#: 0.5 10*3/uL (ref 0.1–0.9)
MONO%: 10.6 % (ref 0.0–14.0)
NEUT#: 2.5 10*3/uL (ref 1.5–6.5)
NEUT%: 58.3 % (ref 39.0–75.0)
PLATELETS: 169 10*3/uL (ref 140–400)
RBC: 3.94 10*6/uL — ABNORMAL LOW (ref 4.20–5.82)
RDW: 14.2 % (ref 11.0–14.6)
WBC: 4.4 10*3/uL (ref 4.0–10.3)
lymph#: 1 10*3/uL (ref 0.9–3.3)

## 2016-10-09 LAB — MAGNESIUM: Magnesium: 2.1 mg/dl (ref 1.5–2.5)

## 2016-10-09 LAB — CHCC SMEAR

## 2016-10-21 ENCOUNTER — Other Ambulatory Visit: Payer: Self-pay | Admitting: Oncology

## 2016-10-21 NOTE — Progress Notes (Unsigned)
[name] [name] with his latest results, which are essentially unchanged. We are going to recheck in one month. Goal is 0.1 or less within the next month or 2 or switch  He is tolerating treamtnt bettwe taking it BID-- just came back in from a run

## 2016-11-06 ENCOUNTER — Other Ambulatory Visit (HOSPITAL_BASED_OUTPATIENT_CLINIC_OR_DEPARTMENT_OTHER): Payer: BLUE CROSS/BLUE SHIELD

## 2016-11-06 DIAGNOSIS — C9210 Chronic myeloid leukemia, BCR/ABL-positive, not having achieved remission: Secondary | ICD-10-CM

## 2016-11-06 LAB — CBC WITH DIFFERENTIAL/PLATELET
BASO%: 0.4 % (ref 0.0–2.0)
BASOS ABS: 0 10*3/uL (ref 0.0–0.1)
EOS ABS: 0.4 10*3/uL (ref 0.0–0.5)
EOS%: 7.7 % — ABNORMAL HIGH (ref 0.0–7.0)
HEMATOCRIT: 37.3 % — AB (ref 38.4–49.9)
HEMOGLOBIN: 12.3 g/dL — AB (ref 13.0–17.1)
LYMPH#: 1.2 10*3/uL (ref 0.9–3.3)
LYMPH%: 25.3 % (ref 14.0–49.0)
MCH: 32.4 pg (ref 27.2–33.4)
MCHC: 33 g/dL (ref 32.0–36.0)
MCV: 98.2 fL — AB (ref 79.3–98.0)
MONO#: 0.4 10*3/uL (ref 0.1–0.9)
MONO%: 9.2 % (ref 0.0–14.0)
NEUT#: 2.7 10*3/uL (ref 1.5–6.5)
NEUT%: 57.4 % (ref 39.0–75.0)
Platelets: 159 10*3/uL (ref 140–400)
RBC: 3.8 10*6/uL — ABNORMAL LOW (ref 4.20–5.82)
RDW: 14.9 % — ABNORMAL HIGH (ref 11.0–14.6)
WBC: 4.7 10*3/uL (ref 4.0–10.3)

## 2016-11-06 LAB — COMPREHENSIVE METABOLIC PANEL
ALT: 21 U/L (ref 0–55)
ANION GAP: 9 meq/L (ref 3–11)
AST: 26 U/L (ref 5–34)
Albumin: 3.9 g/dL (ref 3.5–5.0)
Alkaline Phosphatase: 73 U/L (ref 40–150)
BILIRUBIN TOTAL: 0.81 mg/dL (ref 0.20–1.20)
BUN: 22.9 mg/dL (ref 7.0–26.0)
CHLORIDE: 106 meq/L (ref 98–109)
CO2: 28 meq/L (ref 22–29)
CREATININE: 1.3 mg/dL (ref 0.7–1.3)
Calcium: 8.9 mg/dL (ref 8.4–10.4)
EGFR: 62 mL/min/{1.73_m2} — ABNORMAL LOW (ref >90)
GLUCOSE: 83 mg/dL (ref 70–140)
Potassium: 4.2 mEq/L (ref 3.5–5.1)
SODIUM: 142 meq/L (ref 136–145)
TOTAL PROTEIN: 6.4 g/dL (ref 6.4–8.3)

## 2016-11-19 ENCOUNTER — Other Ambulatory Visit: Payer: Self-pay | Admitting: Oncology

## 2016-11-19 NOTE — Progress Notes (Unsigned)
[facility] in note with this latest results. He is now just under 0.2. I would push on

## 2016-12-04 ENCOUNTER — Other Ambulatory Visit (HOSPITAL_BASED_OUTPATIENT_CLINIC_OR_DEPARTMENT_OTHER): Payer: BLUE CROSS/BLUE SHIELD

## 2016-12-04 DIAGNOSIS — C9210 Chronic myeloid leukemia, BCR/ABL-positive, not having achieved remission: Secondary | ICD-10-CM | POA: Diagnosis not present

## 2016-12-04 LAB — COMPREHENSIVE METABOLIC PANEL
ALT: 31 U/L (ref 0–55)
ANION GAP: 8 meq/L (ref 3–11)
AST: 30 U/L (ref 5–34)
Albumin: 3.9 g/dL (ref 3.5–5.0)
Alkaline Phosphatase: 76 U/L (ref 40–150)
BILIRUBIN TOTAL: 0.75 mg/dL (ref 0.20–1.20)
BUN: 22.6 mg/dL (ref 7.0–26.0)
CALCIUM: 9.1 mg/dL (ref 8.4–10.4)
CHLORIDE: 105 meq/L (ref 98–109)
CO2: 28 mEq/L (ref 22–29)
CREATININE: 1.2 mg/dL (ref 0.7–1.3)
EGFR: 66 mL/min/{1.73_m2} — ABNORMAL LOW (ref >90)
Glucose: 76 mg/dl (ref 70–140)
Potassium: 3.9 mEq/L (ref 3.5–5.1)
Sodium: 141 mEq/L (ref 136–145)
TOTAL PROTEIN: 6.4 g/dL (ref 6.4–8.3)

## 2016-12-04 LAB — CBC WITH DIFFERENTIAL/PLATELET
BASO%: 0.6 % (ref 0.0–2.0)
Basophils Absolute: 0 10*3/uL (ref 0.0–0.1)
EOS ABS: 0.3 10*3/uL (ref 0.0–0.5)
EOS%: 7.9 % — ABNORMAL HIGH (ref 0.0–7.0)
HCT: 36.8 % — ABNORMAL LOW (ref 38.4–49.9)
HGB: 12.5 g/dL — ABNORMAL LOW (ref 13.0–17.1)
LYMPH%: 23.8 % (ref 14.0–49.0)
MCH: 33.5 pg — ABNORMAL HIGH (ref 27.2–33.4)
MCHC: 33.9 g/dL (ref 32.0–36.0)
MCV: 98.7 fL — AB (ref 79.3–98.0)
MONO#: 0.4 10*3/uL (ref 0.1–0.9)
MONO%: 10.5 % (ref 0.0–14.0)
NEUT#: 2.4 10*3/uL (ref 1.5–6.5)
NEUT%: 57.2 % (ref 39.0–75.0)
Platelets: 153 10*3/uL (ref 140–400)
RBC: 3.73 10*6/uL — AB (ref 4.20–5.82)
RDW: 14.6 % (ref 11.0–14.6)
WBC: 4.2 10*3/uL (ref 4.0–10.3)
lymph#: 1 10*3/uL (ref 0.9–3.3)

## 2016-12-11 ENCOUNTER — Other Ambulatory Visit: Payer: Self-pay | Admitting: Oncology

## 2016-12-11 NOTE — Progress Notes (Unsigned)
[name] [name] and let him know the results of the latest PCR.HDL ratio. It is essentially stable, it was 0.14 last month and it is 0.18 this month. This is on a double dose of imatinib. Height  I think this is a very good time to switch to nilotinib area very concerned that of course is he has to take it twice daily on an empty stomach. He is already taking the imatinib twice daily so that part is the same. Possibly you could take it first thing in the morning, have breakfast 2 hours later, and then take the second dose 2 hours before supper on an empty stomach (nothing to prior hour)  I'm going to suggest we have this is 6 months try. If we do that into an MR will probably simply continue on Nilotinib and start monitoring every 2 months and every 3 months

## 2016-12-18 ENCOUNTER — Encounter: Payer: Self-pay | Admitting: Oncology

## 2016-12-18 ENCOUNTER — Ambulatory Visit (HOSPITAL_BASED_OUTPATIENT_CLINIC_OR_DEPARTMENT_OTHER): Payer: BLUE CROSS/BLUE SHIELD

## 2016-12-18 ENCOUNTER — Ambulatory Visit (HOSPITAL_BASED_OUTPATIENT_CLINIC_OR_DEPARTMENT_OTHER): Payer: BLUE CROSS/BLUE SHIELD | Admitting: Oncology

## 2016-12-18 VITALS — BP 118/65 | HR 67 | Temp 97.7°F | Resp 18 | Ht 74.0 in | Wt 202.7 lb

## 2016-12-18 DIAGNOSIS — C9210 Chronic myeloid leukemia, BCR/ABL-positive, not having achieved remission: Secondary | ICD-10-CM | POA: Diagnosis not present

## 2016-12-18 LAB — CBC WITH DIFFERENTIAL/PLATELET
BASO%: 0.7 % (ref 0.0–2.0)
BASOS ABS: 0 10*3/uL (ref 0.0–0.1)
EOS ABS: 0.2 10*3/uL (ref 0.0–0.5)
EOS%: 4.3 % (ref 0.0–7.0)
HCT: 38.1 % — ABNORMAL LOW (ref 38.4–49.9)
HEMOGLOBIN: 12.7 g/dL — AB (ref 13.0–17.1)
LYMPH%: 14.8 % (ref 14.0–49.0)
MCH: 33.1 pg (ref 27.2–33.4)
MCHC: 33.5 g/dL (ref 32.0–36.0)
MCV: 98.9 fL — AB (ref 79.3–98.0)
MONO#: 0.6 10*3/uL (ref 0.1–0.9)
MONO%: 11.3 % (ref 0.0–14.0)
NEUT#: 3.9 10*3/uL (ref 1.5–6.5)
NEUT%: 68.9 % (ref 39.0–75.0)
Platelets: 180 10*3/uL (ref 140–400)
RBC: 3.85 10*6/uL — ABNORMAL LOW (ref 4.20–5.82)
RDW: 14.8 % — ABNORMAL HIGH (ref 11.0–14.6)
WBC: 5.6 10*3/uL (ref 4.0–10.3)
lymph#: 0.8 10*3/uL — ABNORMAL LOW (ref 0.9–3.3)

## 2016-12-18 LAB — COMPREHENSIVE METABOLIC PANEL
ALT: 21 U/L (ref 0–55)
AST: 27 U/L (ref 5–34)
Albumin: 4 g/dL (ref 3.5–5.0)
Alkaline Phosphatase: 71 U/L (ref 40–150)
Anion Gap: 11 mEq/L (ref 3–11)
BUN: 20.6 mg/dL (ref 7.0–26.0)
CO2: 26 meq/L (ref 22–29)
CREATININE: 1.4 mg/dL — AB (ref 0.7–1.3)
Calcium: 9 mg/dL (ref 8.4–10.4)
Chloride: 106 mEq/L (ref 98–109)
EGFR: 58 mL/min/{1.73_m2} — ABNORMAL LOW (ref >90)
Glucose: 101 mg/dl (ref 70–140)
Potassium: 4.2 mEq/L (ref 3.5–5.1)
Sodium: 143 mEq/L (ref 136–145)
Total Bilirubin: 0.89 mg/dL (ref 0.20–1.20)
Total Protein: 6.7 g/dL (ref 6.4–8.3)

## 2016-12-18 LAB — MAGNESIUM: MAGNESIUM: 2 mg/dL (ref 1.5–2.5)

## 2016-12-18 MED ORDER — NILOTINIB HCL 150 MG PO CAPS: 300.0000 mg | ORAL_CAPSULE | Freq: Two times a day (BID) | ORAL | 6 refills | Status: DC

## 2016-12-18 NOTE — Progress Notes (Signed)
[place]  Telephone:([phone] Fax:(336) [phone]     ID: DEVLIN BRINK DOB: Dec 27, 1965  MR#: [id number]  YIA#:[id number]  Patient Care Team: Hulan Fess, [name], [name], [name], [name], [name], MD as Consulting Physician (Cardiology) Gloriann Loan, MD as Attending Physician PCP: Gennette Pac, MD OTHER MD:  CHIEF COMPLAINT:  Chronic myeloid leukemia  CURRENT TREATMENT:  nilatinib   HISTORY OF PRESENT ILLNESS: From the original intake note:  [name] (the final "e" is pronounced) had a complete blood count as part of his annual physical 10/15/2014 and this showed a white cell count of 7.3, hemoglobin 13.9, and platelets under 96,000. The differential was unremarkable.    more recently, as preop testing for anticipated back surgery he had a repeat CBC yesterday, 08/24/2015. This showed a white cell count of 72.9 thousand.  Hemoglobin and platelet count as well as creatinine were reported as normal. No differential was done. The patient was referred to Dr. Rex Kras who today repeated the CBC, finding a white cell count of 78.2 thousand, hemoglobin 14.2, and platelets 243,000. He requested we evaluate the patient urgently.    Jerre's subsequent history is as detailed below   INTERVAL HISTORY: Leona returns today for follow-up of his chronic myeloid leukemia accompanied by his wife. He continues on imatinib. We had THE dose to 800 mg once a day but he tolerated that poorly. He did better when we stated into 2 doses of 400 mg. The only symptom he is having on that dose is diarrhea. This occurs anywhere from 3-5 times a day and it and be troublesome.  I have already called them with his most recent reading. We  have been able to get the BCR.ABL ratio down to less than 0.2 on imatinib but I don't think we are going to get much further and at this point we are ready to switch treatment to a second-generation agent  REVIEW OF SYSTEMS: [name] i continues to exercise regularly including running. He denies pain, rash, bleeding, or other unusual side effects. A detailed review of systems today was stable  PAST MEDICAL HISTORY: Past Medical History:  Diagnosis Date  . Arthritis   . 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 Depression   . GERD (gastroesophageal reflux disease)   . Hyperlipidemia   . No pertinent past medical history   . OSA (obstructive sleep apnea)    mild so patient did not want to be on CPAP  . Sinus disorder     PAST SURGICAL HISTORY: Past Surgical History:  Procedure Laterality Date  . CARDIAC CATHETERIZATION  99,04   both normal, chest pain felt secondary to coronary vasospasm  . HAND / FINGER LESION EXCISION  2009   fx lt metacarpal  . NASAL SEPTOPLASTY W/ TURBINOPLASTY  08/13/2011   Procedure: NASAL SEPTOPLASTY WITH TURBINATE REDUCTION;  Surgeon: Tyson Alias;  Location: [facility];  Service: ENT;  Laterality: N/A;  with LAUP    FAMILY HISTORY No family history on file.  the patient's father is living, age 19. He had 2 sisters, both with breast cancer diagnosed in their 15s and 76s. The patient's mother died from breast cancer at the age of 72 , it was diagnosed age 32.  The patient has one brother and one sister, both in good health. There is no history of hematologic malignancies in the family.  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  He has a child from a first marriage, [name], who lives in [place] is planning to become a Secretary/administrator. He has been married 12 years to [name] , and they have a daughter, [name] , 102, at home. The patient  attends the [employer] in [place]:  Not in place   HEALTH MAINTENANCE: Social History  Substance Use Topics  . Smoking status: Former Research scientist (life sciences)  . Smokeless tobacco: Not on file  . Alcohol use Yes     Colonoscopy: 2010/ Amedeo Plenty  PSA:  Bone density:  Lipid panel:  No Known Allergies  Current Outpatient Prescriptions  Medication Sig Dispense Refill  . aspirin 81 MG tablet Take 81 mg by mouth daily.    . diphenhydrAMINE (BENADRYL) 25 MG tablet Take 25 mg by mouth every 6 (six) hours as needed for sleep.    Marland Kitchen loperamide (IMODIUM) 2 MG capsule Take 1 capsule (2 mg total) by mouth as needed for diarrhea or loose stools. 30 capsule 4  . nilotinib (TASIGNA) 150 MG capsule Take 2 capsules (300 mg total) by mouth every 12 (twelve) hours. 120 capsule 6  . omeprazole (PRILOSEC) 20 MG capsule Take 20 mg by mouth daily.       No current facility-administered medications for this visit.     OBJECTIVE:  Middle-aged white man In no acute distress Vitals:   12/18/16 0843  BP: 118/65  Pulse: 67  Resp: 18  Temp: 97.7 F (36.5 C)     Body mass index is 26.03 kg/m.    ECOG FS:1 - Symptomatic but completely ambulatory  Sclerae unicteric, EOMs intact Oropharynx clear and moist No cervical or supraclavicular adenopathy Lungs no rales or rhonchi Heart regular rate and rhythm Abd soft, nontender, positive bowel sounds MSK no focal spinal tenderness, no upper extremity lymphedema Neuro: nonfocal, well oriented, appropriate affect  LAB RESULTS: CMP     Component Value Date/Time   NA 143 12/18/2016 0936   K 4.2 12/18/2016 0936   CO2 26 12/18/2016 0936   GLUCOSE 101 12/18/2016 0936   BUN 20.6 12/18/2016 0936   CREATININE 1.4 (H) 12/18/2016 0936   CALCIUM 9.0 12/18/2016 0936   PROT 6.7 12/18/2016 0936   ALBUMIN 4.0 12/18/2016 0936   AST 27 12/18/2016 0936   ALT 21 12/18/2016 0936   ALKPHOS 71 12/18/2016 0936   BILITOT 0.89 12/18/2016 0936    INo results  found for: SPEP, UPEP  Lab Results  Component Value Date   WBC 5.6 12/18/2016   NEUTROABS 3.9 12/18/2016   HGB 12.7 (L) 12/18/2016   HCT 38.1 (L) 12/18/2016   MCV 98.9 (H) 12/18/2016   PLT 180 12/18/2016      Chemistry      Component Value Date/Time   NA 143 12/18/2016 0936   K 4.2 12/18/2016 0936   CO2 26 12/18/2016 0936   BUN 20.6 12/18/2016 0936   CREATININE 1.4 (H) 12/18/2016 0936      Component Value Date/Time   CALCIUM 9.0 12/18/2016 0936   ALKPHOS 71 12/18/2016 0936   AST 27 12/18/2016 0936   ALT 21 12/18/2016 0936   BILITOT 0.89 12/18/2016 0936       No results found for: LABCA2  No components found for: [id number]  No results for input(s): INR in the last 168 hours.  Urinalysis No results found for: COLORURINE, APPEARANCEUR, LABSPEC, PHURINE, GLUCOSEU, HGBUR, BILIRUBINUR, KETONESUR, PROTEINUR, UROBILINOGEN, NITRITE, LEUKOCYTESUR  STUDIES: No results found.GROSS AND MICROSCOPIC INFORMATION  ASSESSMENT: 51 y.o. [place] man presenting 08/25/2015 with a white cell count of greater than 70,000, primarily myeloid cells, in the absence of any symptoms or signs of infection, with no splenomegaly by exam, basophils 4% and with no blasts seen on the peripheral smear, normal hemoglobin and platelet count  (a) Sokal score 0.64 (low risk)  (b) bone marrow biopsy 09/01/2015 shows 0% blasts  (c) electrocardiogram 11/24/2013 shows a QT/QTc of 424/422  (1) imatinib 400 mg/d started 09/06/2015, discontinued April 2018    (2) bcr/abl1 baseline 08/26/2015 = 116.73%, with WBC 43.7  (a) first month reading ratio 46% with complete hematologic response  (b) second month reading 56% with continuing hematologic response  (c) third month reading 40%   (i) no ABL mutation identified March 2017  (d) fourth month reading 10.9%  (e) imatinib dose increased to 600 mg/day as of 04/02/2016  (f) bcr.abl 6.7% on 04/10/2016  (g) further downward trend interrupted December 2017 with  slight rise in bcr.abl %   (h) imatinib dose increased to 400 mg po BID as of 08/10/2016  (i) final reading on imatinib, 12/04/2016, showed a ratio of 0.18  (3) nilotinib started April 2018  (a) baseline QTc 425 on 12/18/2016    PLAN: Cleofas has done well with imatinib. It has brought down his bcr.abl ratio to <0.2. However the values have plateau'd there. Furthermore, we had to increase his dose to 800 mg a day (400 mg twice a day) and this has caused him significant bowel movement issues, as noted above.  I think we are ready to move to nilotinib. He is very aware of the possible toxicities, side effects and complications of this agent and this was reviewed again today. Specifically I suggest that he take 1 tablet at waking up, and then have breakfast 2 hours later. He would have a second tablet say around 4 in the afternoon, without eating a snack for at least an hour before, and then again at supper after 6 PM. Since he was already taking the imatinib twice daily, this is not a major change.  We checked his EKG today and he has an excellent QT corrected interval. We will check it again in a month and he is really scheduled to meet with Dr. Rockwell Alexandria in June. If there is no significant change in the QTc after 3 months we will start checking it every 3 months for the next year.  Today we also checked his potassium and magnesium levels, which are fine. We will continue to monitor those with his other lab work.  He brought up some documents where his insurance company was refusing to pay for some labs. This needs to be appealed and we are forwarding this to our billing department.  Otherwise he will have his next BCR.ABL determination in 4 weeks and he will see me 2 weeks after that to review his initial response. He will let me know if there are any unusual side effects. The goal of course is a major molecular response which may allow him after a few years to safely attempted to go off  treatment.   Chauncey Cruel, MD   12/18/2016 2:05 PM Medical Oncology and Hematology [facility] [address] [place], [place] [address] Tel. [phone]  Fax. [phone] 

## 2016-12-25 ENCOUNTER — Ambulatory Visit (HOSPITAL_BASED_OUTPATIENT_CLINIC_OR_DEPARTMENT_OTHER): Payer: BLUE CROSS/BLUE SHIELD | Admitting: Oncology

## 2016-12-25 ENCOUNTER — Ambulatory Visit (HOSPITAL_COMMUNITY)
Admission: RE | Admit: 2016-12-25 | Discharge: 2016-12-25 | Disposition: A | Discharge status: Home / Self Care | Payer: BLUE CROSS/BLUE SHIELD | Source: Ambulatory Visit | Attending: Oncology | Admitting: Oncology

## 2016-12-25 ENCOUNTER — Ambulatory Visit (HOSPITAL_BASED_OUTPATIENT_CLINIC_OR_DEPARTMENT_OTHER): Payer: BLUE CROSS/BLUE SHIELD

## 2016-12-25 ENCOUNTER — Telehealth: Payer: Self-pay | Admitting: Pharmacist

## 2016-12-25 VITALS — BP 113/66 | HR 50 | Temp 98.0°F | Resp 20 | Ht 74.0 in | Wt 208.1 lb

## 2016-12-25 DIAGNOSIS — R198 Other specified symptoms and signs involving the digestive system and abdomen: Secondary | ICD-10-CM

## 2016-12-25 DIAGNOSIS — C9210 Chronic myeloid leukemia, BCR/ABL-positive, not having achieved remission: Secondary | ICD-10-CM | POA: Diagnosis not present

## 2016-12-25 DIAGNOSIS — K8500 Idiopathic acute pancreatitis without necrosis or infection: Secondary | ICD-10-CM | POA: Diagnosis present

## 2016-12-25 DIAGNOSIS — K859 Acute pancreatitis without necrosis or infection, unspecified: Secondary | ICD-10-CM | POA: Insufficient documentation

## 2016-12-25 LAB — COMPREHENSIVE METABOLIC PANEL
ALT: 35 U/L (ref 0–55)
AST: 31 U/L (ref 5–34)
Albumin: 3.9 g/dL (ref 3.5–5.0)
Alkaline Phosphatase: 82 U/L (ref 40–150)
Anion Gap: 8 mEq/L (ref 3–11)
BUN: 17.8 mg/dL (ref 7.0–26.0)
CO2: 26 meq/L (ref 22–29)
Calcium: 9.2 mg/dL (ref 8.4–10.4)
Chloride: 107 mEq/L (ref 98–109)
Creatinine: 1.2 mg/dL (ref 0.7–1.3)
EGFR: 70 mL/min/{1.73_m2} — AB (ref >90)
GLUCOSE: 98 mg/dL (ref 70–140)
POTASSIUM: 3.9 meq/L (ref 3.5–5.1)
SODIUM: 141 meq/L (ref 136–145)
Total Bilirubin: 1.62 mg/dL — ABNORMAL HIGH (ref 0.20–1.20)
Total Protein: 6.6 g/dL (ref 6.4–8.3)

## 2016-12-25 LAB — CBC WITH DIFFERENTIAL/PLATELET
BASO%: 0.5 % (ref 0.0–2.0)
BASOS ABS: 0 10*3/uL (ref 0.0–0.1)
EOS%: 4.3 % (ref 0.0–7.0)
Eosinophils Absolute: 0.3 10*3/uL (ref 0.0–0.5)
HEMATOCRIT: 35.3 % — AB (ref 38.4–49.9)
HGB: 11.7 g/dL — ABNORMAL LOW (ref 13.0–17.1)
LYMPH#: 1.3 10*3/uL (ref 0.9–3.3)
LYMPH%: 19.3 % (ref 14.0–49.0)
MCH: 33.1 pg (ref 27.2–33.4)
MCHC: 33.3 g/dL (ref 32.0–36.0)
MCV: 99.5 fL — ABNORMAL HIGH (ref 79.3–98.0)
MONO#: 0.8 10*3/uL (ref 0.1–0.9)
MONO%: 11.6 % (ref 0.0–14.0)
NEUT#: 4.3 10*3/uL (ref 1.5–6.5)
NEUT%: 64.3 % (ref 39.0–75.0)
Platelets: 177 10*3/uL (ref 140–400)
RBC: 3.54 10*6/uL — ABNORMAL LOW (ref 4.20–5.82)
RDW: 14.4 % (ref 11.0–14.6)
WBC: 6.7 10*3/uL (ref 4.0–10.3)

## 2016-12-25 LAB — MAGNESIUM: Magnesium: 2 mg/dl (ref 1.5–2.5)

## 2016-12-25 NOTE — Progress Notes (Signed)
[name]  Telephone:(336) [phone] Fax:(336) [phone]     ID: [name] [name] DOB: March 28, 1966  MR#: [id number]  JYN#:[id number]  Patient Care Team: Hulan Fess, [name], [name], [name], [name], [name], MD as Consulting Physician (Cardiology) Gloriann Loan, MD as Attending Physician PCP: Gennette Pac, MD OTHER MD:  CHIEF COMPLAINT:  Chronic myeloid leukemia  CURRENT TREATMENT:  nilatinib   HISTORY OF PRESENT ILLNESS: From the original intake note:  [name] [name] (the final "e" is pronounced) had a complete blood count as part of his annual physical 10/15/2014 and this showed a white cell count of 7.3, hemoglobin 13.9, and platelets under 96,000. The differential was unremarkable.    more recently, as preop testing for anticipated back surgery he had a repeat CBC yesterday, 08/24/2015. This showed a white cell count of 72.9 thousand.  Hemoglobin and platelet count as well as creatinine were reported as normal. No differential was done. The patient was referred to Dr. Rex Kras who today repeated the CBC, finding a white cell count of 78.2 thousand, hemoglobin 14.2, and platelets 243,000. He requested we evaluate the patient urgently.    [name] [name]'s subsequent history is as detailed below   INTERVAL HISTORY: [name] [name] returns today for follow-up of his chronic myeloid leukemia. We switched him from imatinib to nilotinib at the last visit. He has been out and about a week. He is actually tolerating it quite well. The significant diarrhea he was having from the prior drug has pretty much resolved. He thinks he had 2 bowel movements yesterday, still loose but much less loose than prior.  He is here today for an unscheduled visit  because he is developed a "bubble feeling" in the middle of his abdomen. This is in the upper part of his rectus muscles, above the belly button, not high enough to be at the point where the Gastro esophageal junction areas. It lateralizes slightly to the left recess. He has had no nausea or vomiting, no sores in his mouth, no altered taste, and no loss of appetite. He had a couple of years in a couple of glasses of wine over the weekend. That didn't change anything. The feeling is constant regardless of whether he is sitting, standing, or lying down. It is not severe and it is not a pain. It kept him from running today and he was able to do some walking and some weights.  EVIEW OF SYSTEMS: A detailed review of systems today was otherwise noncontributory  PAST MEDICAL HISTORY: Past Medical History:  Diagnosis Date  . Arthritis   . 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 Depression   . GERD (gastroesophageal reflux disease)   . Hyperlipidemia   . No pertinent past medical history   . OSA (obstructive sleep apnea)    mild so patient did not want to be on CPAP  . Sinus disorder     PAST SURGICAL HISTORY: Past Surgical History:  Procedure Laterality Date  . CARDIAC CATHETERIZATION  99,04   both normal, chest pain felt secondary to coronary vasospasm  . HAND / FINGER LESION EXCISION  2009   fx lt metacarpal  . NASAL SEPTOPLASTY W/ TURBINOPLASTY  08/13/2011   Procedure: NASAL SEPTOPLASTY WITH TURBINATE REDUCTION;  Surgeon: Ileene Hutchinson T  [employer];  Location: [facility];  Service: ENT;  Laterality: N/A;  with LAUP    FAMILY HISTORY No family history on file.  the patient's father is living, age 74. He had 2 sisters, both with breast cancer diagnosed in their 58s and 57s. The patient's mother died from breast cancer at the age of 97 , it was diagnosed age 110. The patient has one brother and one sister, both in good health. There is no  history of hematologic malignancies in the family.  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  He has a child from a first marriage, [name] [name], who lives in [place] is planning to become a Secretary/administrator. He has been married 12 years to [name] [name] , and they have a daughter, [name] [name] , 96, at home. The patient attends the [employer] in [place]:  Not in place   HEALTH MAINTENANCE: Social History  Substance Use Topics  . Smoking status: Former Research scientist (life sciences)  . Smokeless tobacco: Not on file  . Alcohol use Yes     Colonoscopy: 2010/ Amedeo Plenty  PSA:  Bone density:  Lipid panel:  No Known Allergies  Current Outpatient Prescriptions  Medication Sig Dispense Refill  . aspirin 81 MG tablet Take 81 mg by mouth daily.    . diphenhydrAMINE (BENADRYL) 25 MG tablet Take 25 mg by mouth every 6 (six) hours as needed for sleep.    Marland Kitchen loperamide (IMODIUM) 2 MG capsule Take 1 capsule (2 mg total) by mouth as needed for diarrhea or loose stools. 30 capsule 4  . nilotinib (TASIGNA) 150 MG capsule Take 2 capsules (300 mg total) by mouth every 12 (twelve) hours. 120 capsule 6  . omeprazole (PRILOSEC) 20 MG capsule Take 20 mg by mouth daily.       No current facility-administered medications for this visit.     OBJECTIVE:  Middle-aged white manWho appears well Vitals:   12/25/16 1024  BP: 113/66  Pulse: (!) 50  Resp: 20  Temp: 98 F (36.7 C)     Body mass index is 26.72 kg/m.    ECOG FS:1 - Symptomatic but completely ambulatory  Sclerae unicteric, pupils round and equal Oropharynx clear and moist No cervical or supraclavicular adenopathy Lungs no rales or rhonchi Heart regular rate and rhythm Abd soft, nontender to deep palpation, without reboundpositive bowel sounds MSK no focal spinal tenderness, no upper extremity lymphedema Neuro: nonfocal, well oriented, appropriate affect Breasts:   LAB RESULTS: CMP       Component Value Date/Time   NA 143 12/18/2016 0936   K 4.2 12/18/2016 0936   CO2 26 12/18/2016 0936   GLUCOSE 101 12/18/2016 0936   BUN 20.6 12/18/2016 0936   CREATININE 1.4 (H) 12/18/2016 0936   CALCIUM 9.0 12/18/2016 0936   PROT 6.7 12/18/2016 0936   ALBUMIN 4.0 12/18/2016 0936   AST 27 12/18/2016 0936   ALT 21 12/18/2016 0936   ALKPHOS 71 12/18/2016 0936   BILITOT 0.89 12/18/2016 0936    INo results found for: SPEP, UPEP  Lab Results  Component Value Date   WBC 6.7 12/25/2016   NEUTROABS 4.3 12/25/2016   HGB 11.7 (L) 12/25/2016   HCT 35.3 (L) 12/25/2016   MCV 99.5 (H) 12/25/2016   PLT 177 12/25/2016      Chemistry      Component Value Date/Time   NA 143 12/18/2016 0936   K 4.2 12/18/2016 0936  CO2 26 12/18/2016 0936   BUN 20.6 12/18/2016 0936   CREATININE 1.4 (H) 12/18/2016 0936      Component Value Date/Time   CALCIUM 9.0 12/18/2016 0936   ALKPHOS 71 12/18/2016 0936   AST 27 12/18/2016 0936   ALT 21 12/18/2016 0936   BILITOT 0.89 12/18/2016 0936       No results found for: LABCA2  No components found for: LABCA125  No results for input(s): INR in the last 168 hours.  Urinalysis No results found for: COLORURINE, APPEARANCEUR, LABSPEC, PHURINE, GLUCOSEU, HGBUR, BILIRUBINUR, KETONESUR, PROTEINUR, UROBILINOGEN, NITRITE, LEUKOCYTESUR  STUDIES: No results found.GROSS AND MICROSCOPIC INFORMATION  ASSESSMENT: 51 y.o. [name] [name] man presenting 08/25/2015 with a white cell count of greater than 70,000, primarily myeloid cells, in the absence of any symptoms or signs of infection, with no splenomegaly by exam, basophils 4% and with no blasts seen on the peripheral smear, normal hemoglobin and platelet count  (a) Sokal score 0.64 (low risk)  (b) bone marrow biopsy 09/01/2015 shows 0% blasts  (c) electrocardiogram 11/24/2013 shows a QT/QTc of 424/422  (1) imatinib 400 mg/d started 09/06/2015, discontinued April 2018    (2) bcr/abl1 baseline 08/26/2015 =  116.73%, with WBC 43.7  (a) first month reading ratio 46% with complete hematologic response  (b) second month reading 56% with continuing hematologic response  (c) third month reading 40%   (i) no ABL mutation identified March 2017  (d) fourth month reading 10.9%  (e) imatinib dose increased to 600 mg/day as of 04/02/2016  (f) bcr.abl 6.7% on 04/10/2016  (g) further downward trend interrupted December 2017 with slight rise in bcr.abl %   (h) imatinib dose increased to 400 mg po BID as of 08/10/2016  (i) final reading on imatinib, 12/04/2016, showed a ratio of 0.18  (3) nilotinib started April 2018  (a) baseline QTc 425 on 12/18/2016    PLAN: Haydyn Girvan to be tolerating the nilotinib well. Specifically he is not having the 4-6 very loose bowel movements that he was experiencing with the upper doses of imatinib.  I don't have a simple explanation for the "bubble" feeling that he has in his mid abdomen, above the belly button. Now that the frequency and consistency of bowel movements have become a little bit more normal it is possible his bowels are just readjusting. I think it would be prudent to obtain a KUB today and that is being done.  The other possibility is that he may be developing mild pancreatitis. I'm going to check an amylase and lipase today to evaluate that. I'm also cannot be checking his liver function tests testing case.  Assuming all that is negative I have asked them to give me a call a couple of days from now to see the symptoms have resolved or are worsening. In that case we would consider either an abdominal ultrasound or a CT of the abdomen.  We will check the next BCR.ABL level mid-May. He knows to call for any other issues that may develop before his next visit here.   Chauncey Cruel, MD   12/25/2016 11:12 AM Medical Oncology and Hematology [facility] [address] [place], [place] [address] Tel. [phone]    Fax. [phone]

## 2016-12-25 NOTE — Telephone Encounter (Signed)
Oral Chemotherapy Pharmacist Encounter  I called [place] at [phone] to check on status of patient's new Tasigna prescription Per [facility], medication was shipped out on 12/19/16 for copayment $0.  No further needs from [facility] identified at this time. Oral Oncology Clinic will sign off. Please let us know if we can be of assistance in the future.  Johny Drilling, PharmD, BCPS, BCOP 12/25/2016  12:23 PM Oral Oncology Clinic [phone]

## 2016-12-26 LAB — AMYLASE: AMYLASE: 80 U/L (ref 31–124)

## 2016-12-26 LAB — LIPASE: Lipase: 45 U/L (ref 13–78)

## 2017-01-04 ENCOUNTER — Telehealth: Payer: Self-pay | Admitting: Oncology

## 2017-01-04 NOTE — Telephone Encounter (Signed)
MAILED  RECORDS TO [employer]

## 2017-01-15 ENCOUNTER — Other Ambulatory Visit: Payer: BLUE CROSS/BLUE SHIELD

## 2017-01-29 ENCOUNTER — Other Ambulatory Visit: Payer: BLUE CROSS/BLUE SHIELD

## 2017-01-29 ENCOUNTER — Ambulatory Visit: Payer: BLUE CROSS/BLUE SHIELD | Admitting: Oncology

## 2017-01-31 ENCOUNTER — Ambulatory Visit (HOSPITAL_BASED_OUTPATIENT_CLINIC_OR_DEPARTMENT_OTHER): Payer: BLUE CROSS/BLUE SHIELD | Admitting: Oncology

## 2017-01-31 ENCOUNTER — Other Ambulatory Visit (HOSPITAL_BASED_OUTPATIENT_CLINIC_OR_DEPARTMENT_OTHER): Payer: BLUE CROSS/BLUE SHIELD

## 2017-01-31 VITALS — BP 100/47 | HR 48 | Temp 98.1°F | Resp 18 | Ht 74.0 in | Wt 199.3 lb

## 2017-01-31 DIAGNOSIS — C9210 Chronic myeloid leukemia, BCR/ABL-positive, not having achieved remission: Secondary | ICD-10-CM

## 2017-01-31 DIAGNOSIS — L299 Pruritus, unspecified: Secondary | ICD-10-CM

## 2017-01-31 DIAGNOSIS — K8500 Idiopathic acute pancreatitis without necrosis or infection: Secondary | ICD-10-CM

## 2017-01-31 LAB — CBC WITH DIFFERENTIAL/PLATELET
BASO%: 0.7 % (ref 0.0–2.0)
BASOS ABS: 0 10*3/uL (ref 0.0–0.1)
EOS%: 6.5 % (ref 0.0–7.0)
Eosinophils Absolute: 0.4 10*3/uL (ref 0.0–0.5)
HEMATOCRIT: 40.9 % (ref 38.4–49.9)
HGB: 13.5 g/dL (ref 13.0–17.1)
LYMPH#: 1.3 10*3/uL (ref 0.9–3.3)
LYMPH%: 21.9 % (ref 14.0–49.0)
MCH: 32.2 pg (ref 27.2–33.4)
MCHC: 33 g/dL (ref 32.0–36.0)
MCV: 97.6 fL (ref 79.3–98.0)
MONO#: 0.8 10*3/uL (ref 0.1–0.9)
MONO%: 12.4 % (ref 0.0–14.0)
NEUT#: 3.6 10*3/uL (ref 1.5–6.5)
NEUT%: 58.5 % (ref 39.0–75.0)
PLATELETS: 175 10*3/uL (ref 140–400)
RBC: 4.19 10*6/uL — ABNORMAL LOW (ref 4.20–5.82)
RDW: 13.2 % (ref 11.0–14.6)
WBC: 6.1 10*3/uL (ref 4.0–10.3)

## 2017-01-31 LAB — COMPREHENSIVE METABOLIC PANEL
ALBUMIN: 3.8 g/dL (ref 3.5–5.0)
ALK PHOS: 70 U/L (ref 40–150)
ALT: 17 U/L (ref 0–55)
ANION GAP: 8 meq/L (ref 3–11)
AST: 18 U/L (ref 5–34)
BUN: 18 mg/dL (ref 7.0–26.0)
CALCIUM: 8.8 mg/dL (ref 8.4–10.4)
CO2: 25 mEq/L (ref 22–29)
Chloride: 108 mEq/L (ref 98–109)
Creatinine: 1.1 mg/dL (ref 0.7–1.3)
EGFR: 78 mL/min/{1.73_m2} — AB (ref >90)
Glucose: 87 mg/dl (ref 70–140)
POTASSIUM: 4.5 meq/L (ref 3.5–5.1)
Sodium: 140 mEq/L (ref 136–145)
Total Bilirubin: 0.93 mg/dL (ref 0.20–1.20)
Total Protein: 6.8 g/dL (ref 6.4–8.3)

## 2017-01-31 LAB — MAGNESIUM: Magnesium: 2.4 mg/dl (ref 1.5–2.5)

## 2017-01-31 NOTE — Progress Notes (Signed)
Faxed EKG results to Dr.Ganji's office [phone].

## 2017-01-31 NOTE — Progress Notes (Signed)
[facility]  Telephone:(336) [phone] Fax:(336) [phone]     ID: [name] [name] DOB: [birth year]  MR#: [id number]  WTU#:[id number]  Patient Care Team: [name] Fess, MD as PCP - General (Family Medicine) [name], [name] Dad, MD as Consulting Physician (Oncology) [name] Moore, MD as Consulting Physician (Neurosurgery) [name] Marble, MD as Consulting Physician (Otolaryngology) [name] Irani, MD as Consulting Physician (Gastroenterology) [name] Booze, MD as Consulting Physician (Cardiology) [name] Loan, MD as Attending Physician PCP: [name] Fess, MD OTHER MD:  CHIEF COMPLAINT:  Chronic myeloid leukemia  CURRENT TREATMENT:  nilotinib   HISTORY OF PRESENT ILLNESS: From the original intake note:  [name] [name] (the final "e" is pronounced) had a complete blood count as part of his annual physical 10/15/2014 and this showed a white cell count of 7.3, hemoglobin 13.9, and platelets under 96,000. The differential was unremarkable.    more recently, as preop testing for anticipated back surgery he had a repeat CBC yesterday, 08/24/2015. This showed a white cell count of 72.9 thousand.  Hemoglobin and platelet count as well as creatinine were reported as normal. No differential was done. The patient was referred to Dr. Rex [name] who today repeated the CBC, finding a white cell count of 78.2 thousand, hemoglobin 14.2, and platelets 243,000. He requested we evaluate the patient urgently.    [name] [name]'s subsequent history is as detailed below   INTERVAL HISTORY: [name] [name] returns today for follow-up of his chronic myeloid leukemia. He was started on nilotinib towards the end of April. He is tolerating that remarkably well. He has had no rash, headache, visual changes, nausea, vomiting, mouth sores, change in bowel or bladder habits, fatigue, fever, or any other symptom that he is aware of. Because he have to take it on an empty stomach this has changed his snacking habits for the  better he says and he has lost a little bit of weight. He has a little bit of itching on his skin, which is minimal and tolerable, without any erythema or other changes. He continues to have an excellent exercise program and is running "is getting better all the time".  REVIEW OF SYSTEMS: A detailed review of systems today was otherwise entirely benign  PAST MEDICAL HISTORY: Past Medical History:  Diagnosis Date  . Arthritis   . 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 Depression   . GERD (gastroesophageal reflux disease)   . Hyperlipidemia   . No pertinent past medical history   . OSA (obstructive sleep apnea)    mild so patient did not want to be on CPAP  . Sinus disorder     PAST SURGICAL HISTORY: Past Surgical History:  Procedure Laterality Date  . CARDIAC CATHETERIZATION  99,04   both normal, chest pain felt secondary to coronary vasospasm  . HAND / FINGER LESION EXCISION  2009   fx lt metacarpal  . NASAL SEPTOPLASTY W/ TURBINOPLASTY  08/13/2011   Procedure: NASAL SEPTOPLASTY WITH TURBINATE REDUCTION;  Surgeon: [name] [name];  Location: [facility];  Service: ENT;  Laterality: N/A;  with LAUP    FAMILY HISTORY No family history on file.  the patient's father is living, age 70. He had 2 sisters, both with breast cancer diagnosed in their 61s and 48s. The patient's mother died from breast cancer at the age of 65 , it was diagnosed age 83. The patient has one brother and one sister, both in good health. There is  no history of hematologic malignancies in the family.  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  He has a child from a first marriage, [name] [name], who lives in [name] [name] is planning to become a Secretary/administrator. He has been married 12 years to [name] [name] , and they have a daughter, [name] [name] , 59, at home. The patient attends the [employer] in  [place]:  Not in place   HEALTH MAINTENANCE: Social History  Substance Use Topics  . Smoking status: Former Research scientist (life sciences)  . Smokeless tobacco: Not on file  . Alcohol use Yes     Colonoscopy: 2010/ Amedeo Plenty  PSA:  Bone density:  Lipid panel:  No Known Allergies  Current Outpatient Prescriptions  Medication Sig Dispense Refill  . aspirin 81 MG tablet Take 81 mg by mouth daily.    . diphenhydrAMINE (BENADRYL) 25 MG tablet Take 25 mg by mouth every 6 (six) hours as needed for sleep.    Marland Kitchen loperamide (IMODIUM) 2 MG capsule Take 1 capsule (2 mg total) by mouth as needed for diarrhea or loose stools. 30 capsule 4  . nilotinib (TASIGNA) 150 MG capsule Take 2 capsules (300 mg total) by mouth every 12 (twelve) hours. 120 capsule 6  . omeprazole (PRILOSEC) 20 MG capsule Take 20 mg by mouth daily.       No current facility-administered medications for this visit.     OBJECTIVE:  Middle-aged white man in no acute distress Vitals:   01/31/17 1008  BP: (!) 100/47  Pulse: (!) 48  Resp: 18  Temp: 98.1 F (36.7 C)     Body mass index is 25.59 kg/m.    ECOG FS:0 - Asymptomatic  Sclerae unicteric, EOMs intact Oropharynx clear and moist No cervical or supraclavicular adenopathy Lungs no rales or rhonchi Heart regular rate and rhythm Abd soft, nontender, positive bowel sounds MSK no focal spinal tenderness, no upper extremity lymphedema Neuro: nonfocal, well oriented, appropriate affect Skin: No lesions noted  LAB RESULTS: CMP     Component Value Date/Time   NA 140 01/31/2017 0918   K 4.5 01/31/2017 0918   CO2 25 01/31/2017 0918   GLUCOSE 87 01/31/2017 0918   BUN 18.0 01/31/2017 0918   CREATININE 1.1 01/31/2017 0918   CALCIUM 8.8 01/31/2017 0918   PROT 6.8 01/31/2017 0918   ALBUMIN 3.8 01/31/2017 0918   AST 18 01/31/2017 0918   ALT 17 01/31/2017 0918   ALKPHOS 70 01/31/2017 0918   BILITOT 0.93 01/31/2017 0918    INo results found for: SPEP,  UPEP  Lab Results  Component Value Date   WBC 6.1 01/31/2017   NEUTROABS 3.6 01/31/2017   HGB 13.5 01/31/2017   HCT 40.9 01/31/2017   MCV 97.6 01/31/2017   PLT 175 01/31/2017      Chemistry      Component Value Date/Time   NA 140 01/31/2017 0918   K 4.5 01/31/2017 0918   CO2 25 01/31/2017 0918   BUN 18.0 01/31/2017 0918   CREATININE 1.1 01/31/2017 0918      Component Value Date/Time   CALCIUM 8.8 01/31/2017 0918   ALKPHOS 70 01/31/2017 0918   AST 18 01/31/2017 0918   ALT 17 01/31/2017 0918   BILITOT 0.93 01/31/2017 0918       No results found for: LABCA2  No components found for: LABCA125  No results for input(s): INR in the last 168 hours.  Urinalysis No results found for:  COLORURINE, APPEARANCEUR, LABSPEC, PHURINE, GLUCOSEU, HGBUR, BILIRUBINUR, KETONESUR, PROTEINUR, UROBILINOGEN, NITRITE, LEUKOCYTESUR  STUDIES: EKG today shows his QTC to be actually down, the larger number being 408  ASSESSMENT: 51 y.o. [place] man presenting 08/25/2015 with a white cell count of greater than 70,000, primarily myeloid cells, in the absence of any symptoms or signs of infection, with no splenomegaly by exam, basophils 4% and with no blasts seen on the peripheral smear, normal hemoglobin and platelet count  (a) Sokal score 0.64 (low risk)  (b) bone marrow biopsy 09/01/2015 shows 0% blasts  (c) electrocardiogram 11/24/2013 shows a QT/QTc of 424/422  (1) imatinib 400 mg/d started 09/06/2015, discontinued April 2018   (2) bcr/abl1 baseline 08/26/2015 = 116.73%, with WBC 43.7  (a) first month reading ratio 46% with complete hematologic response  (b) second month reading 56% with continuing hematologic response  (c) third month reading 40%   (i) no ABL mutation identified March 2017  (d) fourth month reading 10.9%  (e) imatinib dose increased to 600 mg/day as of 04/02/2016  (f) bcr.abl 6.7% on 04/10/2016  (g) further downward trend interrupted December 2017 with slight rise in  bcr.abl %   (h) imatinib dose increased to 400 mg po BID as of 08/10/2016  (i) final reading on imatinib, 12/04/2016, showed a ratio of 0.18  (3) nilotinib started April 2018  (a) baseline QTc 425 on 12/18/2016  (b) QTc 01/31/2017 397-408    PLAN: Jhan is doing remarkably well with the Tasigna/ nilotinib. Bili symptom he is actually experiencing is this slight itching, not associated with a rash. This may be due to dry skin and it certainly could be related to the drug. For now we are simply going to follow along and see if that resolves on its own.  I am anticipating a good result from today's lab, which will be available in about 10 days. If so, we are going to start checking his lab work every 2 months with electrocardiograms every 2 months. He was scheduled for an EKG next week through his cardiologist Dr. Einar Gip and Cecilie Lowers requested I send a copy to him which of course we'll be glad to do.  The plan of course is to continue to maximal response. At some point it may be possible for him to go off medication but I do not anticipate that for the next 2 years.  He knows to call for any other issues that may develop before the next visit here.   Chauncey Cruel, MD   01/31/2017 10:33 AM Medical Oncology and Hematology [facility] [address] [place], [place] [address] Tel. [phone]    Fax. [phone]

## 2017-02-12 ENCOUNTER — Other Ambulatory Visit: Payer: Self-pay | Admitting: Oncology

## 2017-02-12 NOTE — Progress Notes (Unsigned)
I called [name] [name] with this latest reading which unfortunately is back up a little to 0.196. Iressa really think the best thing to do at this point is to repeated and this will be done the second week in July. If we are not getting anywhere with the current treatment then I'm going to request a second opinion again at [facility]. He may require bone marrow biopsy also at that time.

## 2017-02-22 ENCOUNTER — Other Ambulatory Visit: Payer: BLUE CROSS/BLUE SHIELD

## 2017-03-15 ENCOUNTER — Other Ambulatory Visit (HOSPITAL_BASED_OUTPATIENT_CLINIC_OR_DEPARTMENT_OTHER): Payer: BLUE CROSS/BLUE SHIELD

## 2017-03-15 DIAGNOSIS — C9210 Chronic myeloid leukemia, BCR/ABL-positive, not having achieved remission: Secondary | ICD-10-CM

## 2017-03-15 DIAGNOSIS — K8500 Idiopathic acute pancreatitis without necrosis or infection: Secondary | ICD-10-CM

## 2017-03-15 LAB — COMPREHENSIVE METABOLIC PANEL
ALT: 20 U/L (ref 0–55)
ANION GAP: 8 meq/L (ref 3–11)
AST: 20 U/L (ref 5–34)
Albumin: 3.7 g/dL (ref 3.5–5.0)
Alkaline Phosphatase: 65 U/L (ref 40–150)
BUN: 16.8 mg/dL (ref 7.0–26.0)
CALCIUM: 8.9 mg/dL (ref 8.4–10.4)
CHLORIDE: 108 meq/L (ref 98–109)
CO2: 26 mEq/L (ref 22–29)
Creatinine: 1.1 mg/dL (ref 0.7–1.3)
EGFR: 75 mL/min/{1.73_m2} — ABNORMAL LOW (ref >90)
Glucose: 70 mg/dl (ref 70–140)
POTASSIUM: 4.2 meq/L (ref 3.5–5.1)
Sodium: 141 mEq/L (ref 136–145)
Total Bilirubin: 0.99 mg/dL (ref 0.20–1.20)
Total Protein: 6.6 g/dL (ref 6.4–8.3)

## 2017-03-15 LAB — CBC WITH DIFFERENTIAL/PLATELET
BASO%: 1.2 % (ref 0.0–2.0)
Basophils Absolute: 0.1 10*3/uL (ref 0.0–0.1)
EOS ABS: 0.3 10*3/uL (ref 0.0–0.5)
EOS%: 4.3 % (ref 0.0–7.0)
HCT: 43.7 % (ref 38.4–49.9)
HGB: 14.5 g/dL (ref 13.0–17.1)
LYMPH%: 19.3 % (ref 14.0–49.0)
MCH: 31.5 pg (ref 27.2–33.4)
MCHC: 33.1 g/dL (ref 32.0–36.0)
MCV: 95.1 fL (ref 79.3–98.0)
MONO#: 0.5 10*3/uL (ref 0.1–0.9)
MONO%: 8.6 % (ref 0.0–14.0)
NEUT#: 4 10*3/uL (ref 1.5–6.5)
NEUT%: 66.6 % (ref 39.0–75.0)
PLATELETS: 183 10*3/uL (ref 140–400)
RBC: 4.59 10*6/uL (ref 4.20–5.82)
RDW: 14 % (ref 11.0–14.6)
WBC: 6 10*3/uL (ref 4.0–10.3)
lymph#: 1.2 10*3/uL (ref 0.9–3.3)

## 2017-03-15 LAB — MAGNESIUM: MAGNESIUM: 2.4 mg/dL (ref 1.5–2.5)

## 2017-04-04 ENCOUNTER — Other Ambulatory Visit (HOSPITAL_BASED_OUTPATIENT_CLINIC_OR_DEPARTMENT_OTHER): Payer: BLUE CROSS/BLUE SHIELD

## 2017-04-04 DIAGNOSIS — C9210 Chronic myeloid leukemia, BCR/ABL-positive, not having achieved remission: Secondary | ICD-10-CM | POA: Diagnosis not present

## 2017-04-04 DIAGNOSIS — K8500 Idiopathic acute pancreatitis without necrosis or infection: Secondary | ICD-10-CM

## 2017-04-04 LAB — COMPREHENSIVE METABOLIC PANEL
ALBUMIN: 3.8 g/dL (ref 3.5–5.0)
ALK PHOS: 63 U/L (ref 40–150)
ALT: 35 U/L (ref 0–55)
AST: 30 U/L (ref 5–34)
Anion Gap: 7 mEq/L (ref 3–11)
BILIRUBIN TOTAL: 1.11 mg/dL (ref 0.20–1.20)
BUN: 18.4 mg/dL (ref 7.0–26.0)
CALCIUM: 9.3 mg/dL (ref 8.4–10.4)
CO2: 27 mEq/L (ref 22–29)
CREATININE: 1.2 mg/dL (ref 0.7–1.3)
Chloride: 106 mEq/L (ref 98–109)
EGFR: 71 mL/min/{1.73_m2} — ABNORMAL LOW (ref >90)
Glucose: 91 mg/dl (ref 70–140)
Potassium: 4.2 mEq/L (ref 3.5–5.1)
Sodium: 140 mEq/L (ref 136–145)
TOTAL PROTEIN: 6.8 g/dL (ref 6.4–8.3)

## 2017-04-04 LAB — MAGNESIUM: Magnesium: 2.1 mg/dl (ref 1.5–2.5)

## 2017-04-04 LAB — CBC WITH DIFFERENTIAL/PLATELET
BASO%: 0.8 % (ref 0.0–2.0)
Basophils Absolute: 0 10*3/uL (ref 0.0–0.1)
EOS ABS: 0.2 10*3/uL (ref 0.0–0.5)
EOS%: 4.1 % (ref 0.0–7.0)
HEMATOCRIT: 44.1 % (ref 38.4–49.9)
HGB: 14.4 g/dL (ref 13.0–17.1)
LYMPH#: 1.3 10*3/uL (ref 0.9–3.3)
LYMPH%: 21.5 % (ref 14.0–49.0)
MCH: 30.6 pg (ref 27.2–33.4)
MCHC: 32.8 g/dL (ref 32.0–36.0)
MCV: 93.3 fL (ref 79.3–98.0)
MONO#: 0.6 10*3/uL (ref 0.1–0.9)
MONO%: 9.8 % (ref 0.0–14.0)
NEUT%: 63.8 % (ref 39.0–75.0)
NEUTROS ABS: 3.7 10*3/uL (ref 1.5–6.5)
PLATELETS: 162 10*3/uL (ref 140–400)
RBC: 4.72 10*6/uL (ref 4.20–5.82)
RDW: 14 % (ref 11.0–14.6)
WBC: 5.9 10*3/uL (ref 4.0–10.3)

## 2017-04-05 ENCOUNTER — Other Ambulatory Visit: Payer: BLUE CROSS/BLUE SHIELD

## 2017-04-07 ENCOUNTER — Other Ambulatory Visit: Payer: Self-pay | Admitting: Oncology

## 2017-04-10 ENCOUNTER — Other Ambulatory Visit: Payer: Self-pay | Admitting: Oncology

## 2017-04-10 NOTE — Progress Notes (Unsigned)
I called creatinine given the latest results of his BCR.ABL. There are little disappointing. I think it may be a good time for you to see Dr. Clint Bolder at [facility] again. [name] [name] is in agreement. and I have sent Dr. Royce Macadamia and email requesting an appointment.

## 2017-05-17 ENCOUNTER — Other Ambulatory Visit: Payer: BLUE CROSS/BLUE SHIELD

## 2017-05-17 ENCOUNTER — Telehealth: Payer: Self-pay | Admitting: Oncology

## 2017-05-17 NOTE — Telephone Encounter (Signed)
Had to reschedule lab weather would not permit

## 2017-05-20 ENCOUNTER — Other Ambulatory Visit (HOSPITAL_BASED_OUTPATIENT_CLINIC_OR_DEPARTMENT_OTHER): Payer: BLUE CROSS/BLUE SHIELD

## 2017-05-20 DIAGNOSIS — C9210 Chronic myeloid leukemia, BCR/ABL-positive, not having achieved remission: Secondary | ICD-10-CM | POA: Diagnosis not present

## 2017-05-20 DIAGNOSIS — K8500 Idiopathic acute pancreatitis without necrosis or infection: Secondary | ICD-10-CM

## 2017-05-20 LAB — COMPREHENSIVE METABOLIC PANEL
ALBUMIN: 3.8 g/dL (ref 3.5–5.0)
ALK PHOS: 64 U/L (ref 40–150)
ALT: 21 U/L (ref 0–55)
AST: 22 U/L (ref 5–34)
Anion Gap: 8 mEq/L (ref 3–11)
BUN: 29.2 mg/dL — AB (ref 7.0–26.0)
CALCIUM: 9.7 mg/dL (ref 8.4–10.4)
CO2: 24 mEq/L (ref 22–29)
Chloride: 108 mEq/L (ref 98–109)
Creatinine: 1.1 mg/dL (ref 0.7–1.3)
EGFR: 75 mL/min/{1.73_m2} — AB (ref >90)
Glucose: 99 mg/dl (ref 70–140)
POTASSIUM: 4.5 meq/L (ref 3.5–5.1)
Sodium: 140 mEq/L (ref 136–145)
TOTAL PROTEIN: 7 g/dL (ref 6.4–8.3)
Total Bilirubin: 1.29 mg/dL — ABNORMAL HIGH (ref 0.20–1.20)

## 2017-05-20 LAB — CBC WITH DIFFERENTIAL/PLATELET
BASO%: 0.6 % (ref 0.0–2.0)
Basophils Absolute: 0.1 10*3/uL (ref 0.0–0.1)
EOS%: 2.8 % (ref 0.0–7.0)
Eosinophils Absolute: 0.3 10*3/uL (ref 0.0–0.5)
HEMATOCRIT: 42.4 % (ref 38.4–49.9)
HEMOGLOBIN: 14.1 g/dL (ref 13.0–17.1)
LYMPH#: 1.2 10*3/uL (ref 0.9–3.3)
LYMPH%: 12.9 % — ABNORMAL LOW (ref 14.0–49.0)
MCH: 30.6 pg (ref 27.2–33.4)
MCHC: 33.2 g/dL (ref 32.0–36.0)
MCV: 92 fL (ref 79.3–98.0)
MONO#: 0.8 10*3/uL (ref 0.1–0.9)
MONO%: 8.2 % (ref 0.0–14.0)
NEUT#: 6.9 10*3/uL — ABNORMAL HIGH (ref 1.5–6.5)
NEUT%: 75.5 % — ABNORMAL HIGH (ref 39.0–75.0)
Platelets: 171 10*3/uL (ref 140–400)
RBC: 4.61 10*6/uL (ref 4.20–5.82)
RDW: 15.2 % — ABNORMAL HIGH (ref 11.0–14.6)
WBC: 9.2 10*3/uL (ref 4.0–10.3)

## 2017-05-20 LAB — MAGNESIUM: Magnesium: 2.1 mg/dl (ref 1.5–2.5)

## 2017-06-05 ENCOUNTER — Telehealth: Payer: Self-pay | Admitting: *Deleted

## 2017-06-05 NOTE — Telephone Encounter (Signed)
"  Mutual patient in [facility] clinic now to see Dr. Royce Macadamia.  We need the BCR-ABL results that are not visible in Care Everywhere.  Our fax number is [phone]."  Faxed with confirmation.

## 2017-06-09 ENCOUNTER — Encounter: Payer: Self-pay | Admitting: Oncology

## 2017-06-09 ENCOUNTER — Other Ambulatory Visit: Payer: Self-pay | Admitting: Oncology

## 2017-06-09 DIAGNOSIS — C9210 Chronic myeloid leukemia, BCR/ABL-positive, not having achieved remission: Secondary | ICD-10-CM

## 2017-06-12 ENCOUNTER — Telehealth: Payer: Self-pay | Admitting: Oncology

## 2017-06-12 NOTE — Telephone Encounter (Signed)
Scheduled appt per 10/8 sch message - patient is aware and sent reminder letter in the mail.

## 2017-06-13 ENCOUNTER — Other Ambulatory Visit: Payer: BLUE CROSS/BLUE SHIELD

## 2017-06-13 ENCOUNTER — Ambulatory Visit: Payer: BLUE CROSS/BLUE SHIELD | Admitting: Oncology

## 2017-08-05 ENCOUNTER — Telehealth: Payer: Self-pay | Admitting: Oncology

## 2017-08-05 NOTE — Telephone Encounter (Signed)
08/05/17 faxed medical records to [facility] to [phone]

## 2017-09-06 NOTE — Progress Notes (Signed)
[facility]  [phone]) [phone] Fax:(336) [phone]     ID: [name] [name] DOB: May 19, 1966  MR#: [id number]  BHA#:[id number]  Patient Care Team: Hulan Fess, MD as PCP - General (Family Medicine) Kanetra Ho, Virgie Dad, [name], [name], [name], [name], MD as Consulting Physician (Cardiology) Gloriann Loan, MD as Attending Physician PCP: Hulan Fess, MD OTHER MD:  CHIEF COMPLAINT:  Chronic myeloid leukemia  CURRENT TREATMENT:  nilotinib   HISTORY OF PRESENT ILLNESS: From the original intake note:  [name] (the final "e" is pronounced) had a complete blood count as part of his annual physical 10/15/2014 and this showed a white cell count of 7.3, hemoglobin 13.9, and platelets under 96,000. The differential was unremarkable.    more recently, as preop testing for anticipated back surgery he had a repeat CBC yesterday, 08/24/2015. This showed a white cell count of 72.9 thousand.  Hemoglobin and platelet count as well as creatinine were reported as normal. No differential was done. The patient was referred to Dr. Rex Kras who today repeated the CBC, finding a white cell count of 78.2 thousand, hemoglobin 14.2, and platelets 243,000. He requested we evaluate the patient urgently.    Kentrel's subsequent history is as detailed below   INTERVAL HISTORY: Reason returns today for follow-up and treatment of his chronic myeloid leukemia.  He continues on nilotinib, with good tolerance. He occasionally gets pimples and rashes on his skin.  He also occasionally gets leg cramps, although these are relatively unusual.  One issue discussed today was ED. We considered sildenafil or similars as discussed below  After his last visit with me he had a second visit with Clint Bolder at [facility].  Dr. Royce Macadamia commented that response to second line agents after first-line failure to obtain an MR is expected to be slower, and initial goal is less than 10% at 6 months.  Of course Mae is doing much better than that.  Dr. Royce Macadamia suggested if he is still not any major molecular response sometime this summer he would want to see him again.  He also suggested that PCR every 3 months at this point would be adequate.  [name] is having his BCR ABL level drawn today.   REVIEW OF SYSTEMS: [name] [name] reports that he is doing well. He notes that he saw Dr. Royce Macadamia recently who said that his blood is doing good. He reports that he takes his medications at 5:30 in the morning and about 5-6 in the afternoon. He recently went to [facility] with his family and ran his 1st marathon. He notes that he was able to finish which made him proud. He also saw a broadway play with his kids. He notes that he runs 25-30 miles per week and lifts weights 4 days per week. He reports some cramping in his legs that have woken him up at night. He notes that he drinks water and electrolytes. He notes that in order to ease the cramping, he runs and walks more. He notes that it is more annoying that anything. He denies unusual headaches, visual changes, nausea, vomiting, or dizziness. There has been no unusual cough, phlegm production, or pleurisy. This been no change in bowel or bladder habits. He denies unexplained fatigue or unexplained weight loss, bleeding, rash, or fever. A detailed review of systems was otherwise stable.    PAST MEDICAL HISTORY:  Past Medical History:  Diagnosis Date  . Arthritis   . 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 Depression   . GERD (gastroesophageal reflux disease)   . Hyperlipidemia   . No pertinent past medical history   . OSA (obstructive sleep apnea)    mild so patient did not want to be on CPAP  . Sinus disorder     PAST SURGICAL  HISTORY: Past Surgical History:  Procedure Laterality Date  . CARDIAC CATHETERIZATION  99,04   both normal, chest pain felt secondary to coronary vasospasm  . HAND / FINGER LESION EXCISION  2009   fx lt metacarpal  . NASAL SEPTOPLASTY W/ TURBINOPLASTY  08/13/2011   Procedure: NASAL SEPTOPLASTY WITH TURBINATE REDUCTION;  Surgeon: Tyson Alias;  Location: [facility];  Service: ENT;  Laterality: N/A;  with LAUP    FAMILY HISTORY No family history on file.  the patient's father is living, age 64. He had 2 sisters, both with breast cancer diagnosed in their 17s and 78s. The patient's mother died from breast cancer at the age of 47 , it was diagnosed age 67. The patient has one brother and one sister, both in good health. There is no history of hematologic malignancies in the family.  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  He has a child from a first marriage, [name] [name], who lives in [place] is planning to become a Secretary/administrator. He has been married 12 years to [name] , and they have a daughter, [name] [name] , 41, at home. The patient attends the [employer] in [place]:  Not in place   HEALTH MAINTENANCE: Social History   Tobacco Use  . Smoking status: Former Smoker  Substance Use Topics  . Alcohol use: Yes  . Drug use: No     Colonoscopy: 2010/ Amedeo Plenty  PSA:  Bone density:  Lipid panel:  No Known Allergies  Current Outpatient Medications  Medication Sig Dispense Refill  . aspirin 81 MG tablet Take 81 mg by mouth daily.    . diphenhydrAMINE (BENADRYL) 25 MG tablet Take 25 mg by mouth every 6 (six) hours as needed for sleep.    Marland Kitchen loperamide (IMODIUM) 2 MG capsule Take 1 capsule (2 mg total) by mouth as needed for diarrhea or loose stools. 30 capsule 4  . TASIGNA 150 MG capsule TAKE 2 CAPSULES (300 MG TOTAL) EVERY 12 HOURS 112 capsule 6   No current facility-administered  medications for this visit.     OBJECTIVE:  Middle-aged white man who appears well  Vitals:   09/09/17 1132  BP: 124/68  Pulse: (!) 45  Resp: 18  Temp: 97.7 F (36.5 C)  SpO2: 100%     Body mass index is 25.81 kg/m.    ECOG FS:0 - Asymptomatic  Sclerae unicteric, pupils round and equal Oropharynx clear and moist No cervical or supraclavicular adenopathy Lungs no rales or rhonchi Heart regular rate and rhythm Abd soft, nontender, positive bowel sounds MSK no focal spinal tenderness, no upper extremity lymphedema Neuro: nonfocal, well oriented, appropriate affect    LAB RESULTS: CMP     Component Value Date/Time   NA 140 05/20/2017 0928   K 4.5 05/20/2017 0928   CO2 24 05/20/2017 0928   GLUCOSE 99 05/20/2017 0928   BUN 29.2 (H) 05/20/2017 0928   CREATININE 1.1 05/20/2017 6283  CALCIUM 9.7 05/20/2017 0928   PROT 7.0 05/20/2017 0928   ALBUMIN 3.8 05/20/2017 0928   AST 22 05/20/2017 0928   ALT 21 05/20/2017 0928   ALKPHOS 64 05/20/2017 0928   BILITOT 1.29 (H) 05/20/2017 0928    INo results found for: SPEP, UPEP  Lab Results  Component Value Date   WBC 9.2 05/20/2017   NEUTROABS 6.9 (H) 05/20/2017   HGB 14.1 05/20/2017   HCT 42.4 05/20/2017   MCV 92.0 05/20/2017   PLT 171 05/20/2017      Chemistry      Component Value Date/Time   NA 140 05/20/2017 0928   K 4.5 05/20/2017 0928   CO2 24 05/20/2017 0928   BUN 29.2 (H) 05/20/2017 0928   CREATININE 1.1 05/20/2017 0928      Component Value Date/Time   CALCIUM 9.7 05/20/2017 0928   ALKPHOS 64 05/20/2017 0928   AST 22 05/20/2017 0928   ALT 21 05/20/2017 0928   BILITOT 1.29 (H) 05/20/2017 0928       No results found for: LABCA2  No components found for: LABCA125  No results for input(s): INR in the last 168 hours.  Urinalysis No results found for: COLORURINE, APPEARANCEUR, LABSPEC, PHURINE, GLUCOSEU, HGBUR, BILIRUBINUR, KETONESUR, PROTEINUR, UROBILINOGEN, NITRITE, LEUKOCYTESUR  STUDIES: His  repeat lab work today is pending   ASSESSMENT: 52 y.o. [place] man presenting 08/25/2015 with a white cell count of greater than 70,000, primarily myeloid cells, in the absence of any symptoms or signs of infection, with no splenomegaly by exam, basophils 4% and with no blasts seen on the peripheral smear, normal hemoglobin and platelet count  (a) Sokal score 0.64 (low risk)  (b) bone marrow biopsy 09/01/2015 shows 0% blasts  (c) electrocardiogram 11/24/2013 shows a QT/QTc of 424/422  (1) imatinib 400 mg/d started 09/06/2015, discontinued April 2018   (2) bcr/abl1 baseline 08/26/2015 = 116.73%, with WBC 43.7  (a) first month reading ratio 46% with complete hematologic response  (b) second month reading 56% with continuing hematologic response  (c) third month reading 40%   (i) no ABL mutation identified March 2017  (d) fourth month reading 10.9%  (e) imatinib dose increased to 600 mg/day as of 04/02/2016  (f) bcr.abl 6.7% on 04/10/2016  (g) further downward trend interrupted December 2017 with slight rise in bcr.abl %   (h) imatinib dose increased to 400 mg po BID as of 08/10/2016  (i) final reading on imatinib, 12/04/2016, showed a ratio of 0.18  (3) nilotinib started April 2018  (a) baseline QTc 425 on 12/18/2016  (b) QTc 01/31/2017 was 397-408    PLAN: Sonya continues to tolerate the nilotinib remarkably well.  He does have a minor rash and problems with occasional cramps but otherwise maintains an excellent functional status.  He is quite aware of the correct way to take the medication, and he is very faithful and regular about it.  We discussed ET issues today, and considered sildenafil and other congeners.  However every single 1 of those phosphodiesterase inhibitors appear to interact withCYP3A4 so therapy would have to be monitored and at this point he does not feel the problem is serious enough for that intervention.  If it does we will simply indeed might closely including  EKGs.  I am setting him up to return to see me in 3 months, with lab work a week before, but if everything is perfect today and then we will simply moved back to visit.  Nilda Keathley, Virgie Dad, MD  09/09/17  11:44 AM Medical Oncology and Hematology [facility] [address] [place], [place] [address] Tel. [phone]    Fax. [phone]  This document serves as a record of services personally performed by Lurline Del, MD. It was created on his behalf by Sheron Nightingale, a trained medical scribe. The creation of this record is based on the scribe's personal observations and the provider's statements to them.   I have reviewed the above documentation for accuracy and completeness, and I agree with the above.

## 2017-09-09 ENCOUNTER — Encounter: Payer: Self-pay | Admitting: Oncology

## 2017-09-09 ENCOUNTER — Inpatient Hospital Stay: Payer: BLUE CROSS/BLUE SHIELD | Attending: Oncology

## 2017-09-09 ENCOUNTER — Telehealth: Payer: Self-pay | Admitting: Oncology

## 2017-09-09 ENCOUNTER — Other Ambulatory Visit: Payer: Self-pay | Admitting: Oncology

## 2017-09-09 ENCOUNTER — Inpatient Hospital Stay (HOSPITAL_BASED_OUTPATIENT_CLINIC_OR_DEPARTMENT_OTHER): Payer: BLUE CROSS/BLUE SHIELD | Admitting: Oncology

## 2017-09-09 VITALS — BP 124/68 | HR 45 | Temp 97.7°F | Resp 18 | Ht 74.0 in | Wt 201.0 lb

## 2017-09-09 DIAGNOSIS — I201 Angina pectoris with documented spasm: Secondary | ICD-10-CM

## 2017-09-09 DIAGNOSIS — Z803 Family history of malignant neoplasm of breast: Secondary | ICD-10-CM | POA: Diagnosis not present

## 2017-09-09 DIAGNOSIS — C9210 Chronic myeloid leukemia, BCR/ABL-positive, not having achieved remission: Secondary | ICD-10-CM | POA: Diagnosis not present

## 2017-09-09 DIAGNOSIS — R21 Rash and other nonspecific skin eruption: Secondary | ICD-10-CM

## 2017-09-09 DIAGNOSIS — K8500 Idiopathic acute pancreatitis without necrosis or infection: Secondary | ICD-10-CM

## 2017-09-09 DIAGNOSIS — R252 Cramp and spasm: Secondary | ICD-10-CM | POA: Insufficient documentation

## 2017-09-09 DIAGNOSIS — N529 Male erectile dysfunction, unspecified: Secondary | ICD-10-CM | POA: Insufficient documentation

## 2017-09-09 DIAGNOSIS — R740 Nonspecific elevation of levels of transaminase and lactic acid dehydrogenase [LDH]: Secondary | ICD-10-CM

## 2017-09-09 DIAGNOSIS — R7401 Elevation of levels of liver transaminase levels: Secondary | ICD-10-CM | POA: Insufficient documentation

## 2017-09-09 LAB — COMPREHENSIVE METABOLIC PANEL
ALBUMIN: 3.9 g/dL (ref 3.5–5.0)
ALT: 77 U/L — ABNORMAL HIGH (ref 0–55)
ANION GAP: 5 (ref 3–11)
AST: 94 U/L — AB (ref 5–34)
Alkaline Phosphatase: 73 U/L (ref 40–150)
BUN: 21 mg/dL (ref 7–26)
CO2: 27 mmol/L (ref 22–29)
Calcium: 9.4 mg/dL (ref 8.4–10.4)
Chloride: 107 mmol/L (ref 98–109)
Creatinine, Ser: 1.1 mg/dL (ref 0.70–1.30)
GFR calc Af Amer: >60 mL/min (ref >60)
GFR calc non Af Amer: >60 mL/min (ref >60)
GLUCOSE: 89 mg/dL (ref 70–140)
POTASSIUM: 4.4 mmol/L (ref 3.5–5.1)
SODIUM: 139 mmol/L (ref 136–145)
Total Bilirubin: 1.2 mg/dL (ref 0.2–1.2)
Total Protein: 6.7 g/dL (ref 6.4–8.3)

## 2017-09-09 LAB — CBC WITH DIFFERENTIAL/PLATELET
ABS GRANULOCYTE: 4.2 10*3/uL (ref 1.5–6.5)
Basophils Absolute: 0.1 10*3/uL (ref 0.0–0.1)
Basophils Relative: 1 %
Eosinophils Absolute: 0.2 10*3/uL (ref 0.0–0.5)
Eosinophils Relative: 3 %
HEMATOCRIT: 42.1 % (ref 38.4–49.9)
HEMOGLOBIN: 13.7 g/dL (ref 13.0–17.1)
LYMPHS ABS: 1.5 10*3/uL (ref 0.9–3.3)
LYMPHS PCT: 23 %
MCH: 30.5 pg (ref 27.2–33.4)
MCHC: 32.5 g/dL (ref 32.0–36.0)
MCV: 93.8 fL (ref 79.3–98.0)
Monocytes Absolute: 0.6 10*3/uL (ref 0.1–0.9)
Monocytes Relative: 10 %
NEUTROS ABS: 4.2 10*3/uL (ref 1.5–6.5)
Neutrophils Relative %: 63 %
Platelets: 170 10*3/uL (ref 140–400)
RBC: 4.49 MIL/uL (ref 4.20–5.82)
RDW: 14.4 % (ref 11.0–15.6)
WBC: 6.6 10*3/uL (ref 4.0–10.3)

## 2017-09-09 LAB — URIC ACID: URIC ACID, SERUM: 5.5 mg/dL (ref 2.6–7.4)

## 2017-09-09 LAB — MAGNESIUM: Magnesium: 2.2 mg/dL (ref 1.5–2.5)

## 2017-09-09 LAB — LACTATE DEHYDROGENASE: LDH: 238 U/L (ref 125–245)

## 2017-09-09 MED ORDER — NILOTINIB HCL 150 MG PO CAPS: 300.0000 mg | ORAL_CAPSULE | Freq: Two times a day (BID) | ORAL | 6 refills | Status: DC

## 2017-09-09 NOTE — Telephone Encounter (Signed)
Gave patient avs and calendar with appts per 1/7 los.  °

## 2017-09-09 NOTE — Progress Notes (Signed)
[name] [name]'s liver function tests are slightly elevated.  This is doubtless due to the line erlotinib.  I am rechecking these in a month.

## 2017-09-17 ENCOUNTER — Other Ambulatory Visit: Payer: Self-pay | Admitting: Oncology

## 2017-09-17 NOTE — Progress Notes (Signed)
I called crank with the results of his BCR ABL results which show him to be in major molecular response with an international scale ratio of 0.07 8 4%.  He is interested in phosphodiesterase 5 inhibitors.  I note an interaction with nilotinib.  This calls for increased surveillance if he is going to be using these perhaps twice a week.  I am a bit uncomfortable prescribing these in the setting so I did ask Dr. Royce Macadamia to give [place] some advice.

## 2017-09-19 LAB — BCR/ABL

## 2017-09-26 ENCOUNTER — Encounter: Payer: Self-pay | Admitting: Oncology

## 2017-09-30 IMAGING — CT CT BIOPSY
1 series · 1 of 26 positions shown · non-contrast
Comparison: none

CLINICAL DATA: 49-year-old male with a history of CML. He has been
referred for a bone marrow biopsy.

[Series 2: localizer · axial · 5.0mm · 0.74mm/px · 1 of 26 slices shown]
[im 14/26]
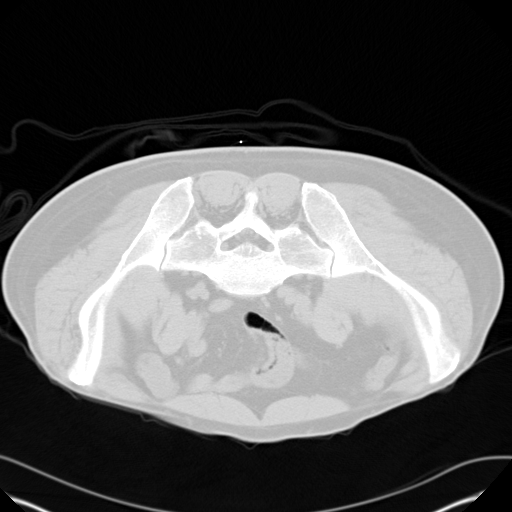

[1 of 26 positions shown; findings below may reference images not displayed]

EXAM:
CT-GUIDED BIOPSY BONE MARROW BIOPSY

MEDICATIONS AND MEDICAL HISTORY:
Versed 4.0 mg, Fentanyl 50 mcg.

Additional Medications: None.

ANESTHESIA/SEDATION:
Moderate sedation time: 9 minutes

PROCEDURE:
The procedure risks, benefits, and alternatives were explained to
the patient. Questions regarding the procedure were encouraged and
answered. The patient understands and consents to the procedure.

Scout CT of the pelvis was performed for surgical planning purposes.

The posterior pelvis was prepped with Betadinein a sterile fashion,
and a sterile drape was applied covering the operative field. A
sterile gown and sterile gloves were used for the procedure. Local
anesthesia was provided with 1% Lidocaine.

We targeted the right posterior iliac bone for biopsy. The skin and
subcutaneous tissues were infiltrated with 1% lidocaine without
epinephrine. A small stab incision was made with an 11 blade
scalpel, and an 11 gauge Liscano needle was advanced with CT guidance
to the posterior cortex. Manual forced was used to advance the
needle through the posterior cortex and the stylet was removed. A
bone marrow aspirate was retrieved and passed to a cytotechnologist
in the room. The Liscano needle was then advanced without the stylet
for a core biopsy. The core biopsy was retrieved and also passed to
a cytotechnologist.

Manual pressure was used for hemostasis and a sterile dressing was
placed.

No complications were encountered no significant blood loss was
encountered.

Patient tolerated the procedure well and remained hemodynamically
stable throughout.
FINDINGS: Scout image demonstrates safe approach to posterior iliac bone.

Images during the case demonstrate placement of 11 gauge Liscano
needle

COMPLICATIONS:
None
IMPRESSION: Status post CT-guided bone marrow biopsy, with tissue specimen sent
to pathology for complete histopathologic analysis

## 2017-10-10 ENCOUNTER — Encounter: Payer: Self-pay | Admitting: Oncology

## 2017-10-14 ENCOUNTER — Other Ambulatory Visit: Payer: BLUE CROSS/BLUE SHIELD

## 2017-12-09 ENCOUNTER — Other Ambulatory Visit: Payer: Self-pay | Admitting: Oncology

## 2017-12-09 DIAGNOSIS — C9210 Chronic myeloid leukemia, BCR/ABL-positive, not having achieved remission: Secondary | ICD-10-CM

## 2017-12-16 ENCOUNTER — Inpatient Hospital Stay: Payer: BLUE CROSS/BLUE SHIELD | Attending: Oncology

## 2017-12-16 ENCOUNTER — Encounter: Payer: Self-pay | Admitting: Oncology

## 2017-12-16 DIAGNOSIS — K8500 Idiopathic acute pancreatitis without necrosis or infection: Secondary | ICD-10-CM

## 2017-12-16 DIAGNOSIS — I201 Angina pectoris with documented spasm: Secondary | ICD-10-CM

## 2017-12-16 DIAGNOSIS — C9210 Chronic myeloid leukemia, BCR/ABL-positive, not having achieved remission: Secondary | ICD-10-CM | POA: Insufficient documentation

## 2017-12-16 DIAGNOSIS — N529 Male erectile dysfunction, unspecified: Secondary | ICD-10-CM | POA: Insufficient documentation

## 2017-12-16 LAB — CBC WITH DIFFERENTIAL/PLATELET
Basophils Absolute: 0 10*3/uL (ref 0.0–0.1)
Basophils Relative: 1 %
EOS PCT: 4 %
Eosinophils Absolute: 0.3 10*3/uL (ref 0.0–0.5)
HCT: 42.5 % (ref 38.4–49.9)
Hemoglobin: 13.8 g/dL (ref 13.0–17.1)
LYMPHS ABS: 1.6 10*3/uL (ref 0.9–3.3)
LYMPHS PCT: 22 %
MCH: 30.5 pg (ref 27.2–33.4)
MCHC: 32.5 g/dL (ref 32.0–36.0)
MCV: 93.8 fL (ref 79.3–98.0)
MONOS PCT: 9 %
Monocytes Absolute: 0.7 10*3/uL (ref 0.1–0.9)
Neutro Abs: 4.8 10*3/uL (ref 1.5–6.5)
Neutrophils Relative %: 64 %
PLATELETS: 171 10*3/uL (ref 140–400)
RBC: 4.53 MIL/uL (ref 4.20–5.82)
RDW: 14.3 % (ref 11.0–14.6)
WBC: 7.4 10*3/uL (ref 4.0–10.3)

## 2017-12-16 LAB — MAGNESIUM: Magnesium: 2 mg/dL (ref 1.7–2.4)

## 2017-12-16 LAB — COMPREHENSIVE METABOLIC PANEL
ALBUMIN: 4 g/dL (ref 3.5–5.0)
ALK PHOS: 64 U/L (ref 40–150)
ALT: 27 U/L (ref 0–55)
AST: 23 U/L (ref 5–34)
Anion gap: 7 (ref 3–11)
BUN: 29 mg/dL — AB (ref 7–26)
CHLORIDE: 106 mmol/L (ref 98–109)
CO2: 26 mmol/L (ref 22–29)
CREATININE: 1.11 mg/dL (ref 0.70–1.30)
Calcium: 9.3 mg/dL (ref 8.4–10.4)
GFR calc non Af Amer: >60 mL/min (ref >60)
GLUCOSE: 94 mg/dL (ref 70–140)
Potassium: 4.3 mmol/L (ref 3.5–5.1)
SODIUM: 139 mmol/L (ref 136–145)
Total Bilirubin: 1 mg/dL (ref 0.2–1.2)
Total Protein: 6.9 g/dL (ref 6.4–8.3)

## 2017-12-16 LAB — LACTATE DEHYDROGENASE: LDH: 141 U/L (ref 125–245)

## 2017-12-27 LAB — BCR/ABL

## 2017-12-27 NOTE — Progress Notes (Signed)
[facility]  [phone] Fax:(336) [phone]     ID: [name] [name] DOB: 02-Oct-1965  MR#: [id number]  OEU#:[id number]  Patient Care Team: [name] Fess, MD as PCP - General (Family Medicine) [name] [name], [name] Dad, MD as Consulting Physician (Oncology) [name] Moore, MD as Consulting Physician (Neurosurgery) [name] Marble, MD as Consulting Physician (Otolaryngology) [name] Irani, MD (Inactive) as Consulting Physician (Gastroenterology) [name] Booze, MD as Consulting Physician (Cardiology) [name] Loan, MD as Attending Physician PCP: [name] Fess, MD OTHER MD:  CHIEF COMPLAINT:  Chronic myeloid leukemia  CURRENT TREATMENT:  nilotinib   HISTORY OF PRESENT ILLNESS: From the original intake note:  [name] [name] (the final "e" is pronounced) had a complete blood count as part of his annual physical 10/15/2014 and this showed a white cell count of 7.3, hemoglobin 13.9, and platelets under 96,000. The differential was unremarkable.    more recently, as preop testing for anticipated back surgery he had a repeat CBC yesterday, 08/24/2015. This showed a white cell count of 72.9 thousand.  Hemoglobin and platelet count as well as creatinine were reported as normal. No differential was done. The patient was referred to Dr. Rex [name] who today repeated the CBC, finding a white cell count of 78.2 thousand, hemoglobin 14.2, and platelets 243,000. He requested we evaluate the patient urgently.    [name] [name]'s subsequent history is as detailed below   INTERVAL HISTORY: [name] [name] returns today for follow-up and treatment of his chronic myeloid leukemia.  He continues on nilotinib, with good tolerance. He noticed that since started the medication, his skin has been dry and itchy. His sclap feels scaly, and he has lost hair on the sides of his eyebrows because they are itchy and he keeps rubbing them. He takes benadryl every night which helps him sleep, but this doesn't help the  itching. Bowel movements are fine, and he denies diarrhea. His energy levels are always high because of his exercise routine.   REVIEW OF SYSTEMS: [name] [name] reports that he is doing good. 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 For exercise, he runs and lifts weight 4 times per week.  There continue to be issues regarding E.D. He denies unusual headaches, visual changes, nausea, vomiting, or dizziness. There has been no unusual cough, phlegm production, or pleurisy. This been no change in bowel or bladder habits. He denies unexplained fatigue or unexplained weight loss, bleeding, rash, or fever. A detailed review of systems was otherwise stable.    PAST MEDICAL HISTORY: Past Medical History:  Diagnosis Date  . Arthritis   . 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 Depression   . GERD (gastroesophageal reflux disease)   . Hyperlipidemia   . No pertinent past medical history   . OSA (obstructive sleep apnea)    mild so patient did not want to be on CPAP  . Sinus disorder     PAST SURGICAL HISTORY: Past Surgical History:  Procedure Laterality Date  . CARDIAC CATHETERIZATION  99,04   both normal, chest pain felt secondary to coronary vasospasm  . HAND / FINGER LESION EXCISION  2009   fx lt metacarpal  . NASAL SEPTOPLASTY W/ TURBINOPLASTY  08/13/2011   Procedure: NASAL SEPTOPLASTY WITH TURBINATE REDUCTION;  Surgeon: [name] [name];  Location: [facility];  Service: ENT;  Laterality: N/A;  with LAUP    FAMILY HISTORY No  family history on file.  the patient's father is living, age 68. He had 2 sisters, both with breast cancer diagnosed in their 47s and 41s. The patient's mother died from breast cancer at the age of 46 , it was diagnosed age 93. The patient has one brother and one sister, both in good health. There is no history of hematologic malignancies in the  family.  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  He has a child from a first marriage, [name] [name], who lives in [name] is planning to become a Secretary/administrator. He has been married 12 years to [name] [name] , and they have a daughter, [name] [name] , 41, at home. The patient attends the [employer] in [place]:  Not in place   HEALTH MAINTENANCE: Social History   Tobacco Use  . Smoking status: Former Smoker  Substance Use Topics  . Alcohol use: Yes  . Drug use: No     Colonoscopy: 2010/ Amedeo Plenty  PSA:  Bone density:  Lipid panel:  No Known Allergies  Current Outpatient Medications  Medication Sig Dispense Refill  . aspirin 81 MG tablet Take 81 mg by mouth daily.    . diphenhydrAMINE (BENADRYL) 25 MG tablet Take 25 mg by mouth every 6 (six) hours as needed for sleep.    Marland Kitchen loperamide (IMODIUM) 2 MG capsule Take 1 capsule (2 mg total) by mouth as needed for diarrhea or loose stools. 30 capsule 4  . TASIGNA 150 MG capsule TAKE 2 CAPSULES (300 MG TOTAL) EVERY 12 HOURS 112 capsule 6   No current facility-administered medications for this visit.     OBJECTIVE:  Middle-aged white man in no acute distress  Vitals:   12/30/17 0958  BP: 130/72  Pulse: (!) 47  Resp: 20  Temp: 98 F (36.7 C)  SpO2: 100%     Body mass index is 25.45 kg/m.    ECOG FS:0 - Asymptomatic  Sclerae unicteric, EOMs intact Oropharynx clear and moist No cervical or supraclavicular adenopathy Lungs no rales or rhonchi Heart regular rate and rhythm Abd soft, nontender, positive bowel sounds MSK no focal spinal tenderness, no upper extremity lymphedema Neuro: nonfocal, well oriented, appropriate affect   LAB RESULTS: CMP     Component Value Date/Time   NA 139 12/16/2017 1002   NA 140 05/20/2017 0928   K 4.3 12/16/2017 1002   K 4.5 05/20/2017 0928   CL 106 12/16/2017 1002   CO2 26 12/16/2017 1002   CO2 24 05/20/2017 0928    GLUCOSE 94 12/16/2017 1002   GLUCOSE 99 05/20/2017 0928   BUN 29 (H) 12/16/2017 1002   BUN 29.2 (H) 05/20/2017 0928   CREATININE 1.11 12/16/2017 1002   CREATININE 1.1 05/20/2017 0928   CALCIUM 9.3 12/16/2017 1002   CALCIUM 9.7 05/20/2017 0928   PROT 6.9 12/16/2017 1002   PROT 7.0 05/20/2017 0928   ALBUMIN 4.0 12/16/2017 1002   ALBUMIN 3.8 05/20/2017 0928   AST 23 12/16/2017 1002   AST 22 05/20/2017 0928   ALT 27 12/16/2017 1002   ALT 21 05/20/2017 0928   ALKPHOS 64 12/16/2017 1002   ALKPHOS 64 05/20/2017 0928   BILITOT 1.0 12/16/2017 1002   BILITOT 1.29 (H) 05/20/2017 0928   GFRNONAA >60 12/16/2017 1002   GFRAA >60 12/16/2017 1002    INo results found for: SPEP, UPEP  Lab Results  Component Value Date   WBC 7.4 12/16/2017   NEUTROABS  4.8 12/16/2017   HGB 13.8 12/16/2017   HCT 42.5 12/16/2017   MCV 93.8 12/16/2017   PLT 171 12/16/2017      Chemistry      Component Value Date/Time   NA 139 12/16/2017 1002   NA 140 05/20/2017 0928   K 4.3 12/16/2017 1002   K 4.5 05/20/2017 0928   CL 106 12/16/2017 1002   CO2 26 12/16/2017 1002   CO2 24 05/20/2017 0928   BUN 29 (H) 12/16/2017 1002   BUN 29.2 (H) 05/20/2017 0928   CREATININE 1.11 12/16/2017 1002   CREATININE 1.1 05/20/2017 0928      Component Value Date/Time   CALCIUM 9.3 12/16/2017 1002   CALCIUM 9.7 05/20/2017 0928   ALKPHOS 64 12/16/2017 1002   ALKPHOS 64 05/20/2017 0928   AST 23 12/16/2017 1002   AST 22 05/20/2017 0928   ALT 27 12/16/2017 1002   ALT 21 05/20/2017 0928   BILITOT 1.0 12/16/2017 1002   BILITOT 1.29 (H) 05/20/2017 0928       No results found for: LABCA2  No components found for: LABCA125  No results for input(s): INR in the last 168 hours.  Urinalysis No results found for: COLORURINE, APPEARANCEUR, LABSPEC, PHURINE, GLUCOSEU, HGBUR, BILIRUBINUR, KETONESUR, PROTEINUR, UROBILINOGEN, NITRITE, LEUKOCYTESUR  STUDIES: Electrocardiogram today shows sinus bradycardia and is athletic  male, but no evidence of ischemia and in particular no significant QTc prolongation  ASSESSMENT: 52 y.o.  man presenting 08/25/2015 with a white cell count of greater than 70,000, primarily myeloid cells, in the absence of any symptoms or signs of infection, with no splenomegaly by exam, basophils 4% and with no blasts seen on the peripheral smear, normal hemoglobin and platelet count  (a) Sokal score 0.64 (low risk)  (b) bone marrow biopsy 09/01/2015 shows 0% blasts  (c) baseline electrocardiogram 11/24/2013 showed a QT/QTc of 424/422  (1) imatinib 400 mg/d started 09/06/2015, discontinued April 2018   (2) bcr/abl1 baseline 08/26/2015 = 116.73%, with WBC 43.7  (a) first month reading ratio 46% with complete hematologic response  (b) second month reading 56% with continuing hematologic response  (c) third month reading 40%   (i) no ABL mutation identified March 2017  (d) fourth month reading 10.9%  (e) imatinib dose increased to 600 mg/day as of 04/02/2016  (f) bcr.abl 6.7% on 04/10/2016  (g) further downward trend interrupted December 2017 with slight rise in bcr.abl %   (h) imatinib dose increased to 400 mg po BID as of 08/10/2016  (i) final reading on imatinib, 12/04/2016, showed a ratio of 0.18  (3) nilotinib started April 2018  (a) baseline QTc 425 on 12/18/2016  (b) QTc 01/31/2017 was 397-408  (c) QT/QTc on 12/30/2017 was 448/387    PLAN: [name] [name] is now a year into his nilotinib.  His BCR.ABL ratio is currently 0.03, lower than 3 months ago.  He is tolerating the treatment well.  We are following his magnesium, LDH and uric acid as well as standard CBC and Cmet.  He tells me he just had lipid studies through his primary care physician and that his LDL is 130.  His HDL was high.  He is working on a diet for that.  We are checking electrocardiography once a year or more frequently if there are any dose changes.  So far he is doing terrific with these monitoring tests.  As  far as the erectile dysfunction issue is concerned, please see the pharmacy note below.  I wrote him for sildenafil 20  mg today.  He may even try half a tablet to see what works.  If it is very expensive he will let me know.  I am hopeful that at some point the BCR ABL ratio will go to unmeasurable.  In the meantime there continues to be excellent disease control and I am making no changes in his medications  He will return in 3 and 6 months for labs and see me again after the six-month lab test  He knows to call for any problems that may develop before the next visit. [name] [name], [name] Dad, MD  12/30/17 10:20 AM Medical Oncology and Hematology [facility] [address] [place], [place] [address] Tel. [phone]    Fax. [phone]  This document serves as a record of services personally performed by Lurline Del, MD. It was created on his behalf by Sheron Nightingale, a trained medical scribe. The creation of this record is based on the scribe's personal observations and the provider's statements to them.   I have reviewed the above documentation for accuracy and completeness, and I agree with the above.   From: Verdie Mosher  Sent: Tuesday, September 17, 2017 11:50 AM To: Gerhard Perches <[email or web address]> Subject: Re: curbside     Nilotinib is a moderate inhibitor of CYP3A4 and all the PDE5 inhibitors are CYP3A4 substrates. I would do the following:     1) would do Viagra over the other 2 (Cialis, Levitra) because Viagra has other minor pathways of elimination unlike the other 2.     2) would start at lowest dose (25 mg) OR even 20 mg (but for this strength must prescribe Revatio which is sildenafil for PAH)     Because nilotinib is also metabolized by CYP3A4 the only effect on nilotinib would be competition for CYP3A4 but I wouldn't be too concerned about that.     A lot of the issues are really when sildenafil is used TID for PAH and  not really for intermittent use.

## 2017-12-30 ENCOUNTER — Telehealth: Payer: Self-pay | Admitting: Oncology

## 2017-12-30 ENCOUNTER — Inpatient Hospital Stay (HOSPITAL_BASED_OUTPATIENT_CLINIC_OR_DEPARTMENT_OTHER): Payer: BLUE CROSS/BLUE SHIELD | Admitting: Oncology

## 2017-12-30 VITALS — BP 130/72 | HR 47 | Temp 98.0°F | Resp 20 | Ht 74.0 in | Wt 198.2 lb

## 2017-12-30 DIAGNOSIS — C9210 Chronic myeloid leukemia, BCR/ABL-positive, not having achieved remission: Secondary | ICD-10-CM

## 2017-12-30 DIAGNOSIS — N529 Male erectile dysfunction, unspecified: Secondary | ICD-10-CM

## 2017-12-30 MED ORDER — SILDENAFIL CITRATE 20 MG PO TABS
20.0000 mg | ORAL_TABLET | Freq: Three times a day (TID) | ORAL | 3 refills | Status: DC
Start: 2017-12-30 — End: 2018-02-25

## 2017-12-30 NOTE — Telephone Encounter (Signed)
Gave patient AVs and calendar of upcoming July through November appointments.

## 2017-12-31 NOTE — Progress Notes (Signed)
Prior auth for Sildenafil Citrate 20 mg has been submitted. Status is pending.

## 2018-01-02 NOTE — Progress Notes (Signed)
Appeal for Sildenafil has been submitted.

## 2018-02-07 ENCOUNTER — Other Ambulatory Visit: Payer: Self-pay | Admitting: Orthopedic Surgery

## 2018-02-13 ENCOUNTER — Encounter (HOSPITAL_BASED_OUTPATIENT_CLINIC_OR_DEPARTMENT_OTHER): Payer: Self-pay | Admitting: *Deleted

## 2018-02-20 ENCOUNTER — Ambulatory Visit (HOSPITAL_BASED_OUTPATIENT_CLINIC_OR_DEPARTMENT_OTHER)
Admission: RE | Admit: 2018-02-20 | Payer: BLUE CROSS/BLUE SHIELD | Source: Ambulatory Visit | Admitting: Orthopedic Surgery

## 2018-02-20 HISTORY — DX: Chronic myeloid leukemia, BCR/ABL-positive, not having achieved remission: C92.10

## 2018-02-20 SURGERY — OSTEOTOMY, WEIL
Anesthesia: General | Laterality: Right

## 2018-02-23 ENCOUNTER — Encounter: Payer: Self-pay | Admitting: Oncology

## 2018-02-25 ENCOUNTER — Other Ambulatory Visit: Payer: Self-pay

## 2018-02-25 DIAGNOSIS — C9210 Chronic myeloid leukemia, BCR/ABL-positive, not having achieved remission: Secondary | ICD-10-CM

## 2018-02-25 MED ORDER — SILDENAFIL CITRATE 25 MG PO TABS: 25.0000 mg | ORAL_TABLET | Freq: Every day | ORAL | 0 refills | Status: AC | PRN

## 2018-02-25 MED ORDER — SILDENAFIL CITRATE 50 MG PO TABS: 25.0000 mg | ORAL_TABLET | Freq: Every day | ORAL | 3 refills | Status: DC | PRN

## 2018-03-28 ENCOUNTER — Telehealth: Payer: Self-pay | Admitting: Oncology

## 2018-03-28 NOTE — Telephone Encounter (Signed)
Patient called to reschedule  °

## 2018-03-31 ENCOUNTER — Other Ambulatory Visit: Payer: BLUE CROSS/BLUE SHIELD

## 2018-04-02 ENCOUNTER — Inpatient Hospital Stay: Payer: BLUE CROSS/BLUE SHIELD | Attending: Oncology

## 2018-04-02 DIAGNOSIS — C9210 Chronic myeloid leukemia, BCR/ABL-positive, not having achieved remission: Secondary | ICD-10-CM | POA: Insufficient documentation

## 2018-04-02 DIAGNOSIS — I201 Angina pectoris with documented spasm: Secondary | ICD-10-CM

## 2018-04-02 DIAGNOSIS — K8500 Idiopathic acute pancreatitis without necrosis or infection: Secondary | ICD-10-CM

## 2018-04-02 LAB — COMPREHENSIVE METABOLIC PANEL
ALBUMIN: 4 g/dL (ref 3.5–5.0)
ALT: 19 U/L (ref 0–44)
ANION GAP: 7 (ref 5–15)
AST: 19 U/L (ref 15–41)
Alkaline Phosphatase: 69 U/L (ref 38–126)
BUN: 23 mg/dL — ABNORMAL HIGH (ref 6–20)
CO2: 28 mmol/L (ref 22–32)
Calcium: 9.4 mg/dL (ref 8.9–10.3)
Chloride: 106 mmol/L (ref 98–111)
Creatinine, Ser: 1.11 mg/dL (ref 0.61–1.24)
GFR calc non Af Amer: >60 mL/min (ref >60)
GLUCOSE: 79 mg/dL (ref 70–99)
Potassium: 4.2 mmol/L (ref 3.5–5.1)
Sodium: 141 mmol/L (ref 135–145)
Total Bilirubin: 1 mg/dL (ref 0.3–1.2)
Total Protein: 6.8 g/dL (ref 6.5–8.1)

## 2018-04-02 LAB — CBC WITH DIFFERENTIAL/PLATELET
BASOS PCT: 1 %
Basophils Absolute: 0 10*3/uL (ref 0.0–0.1)
Eosinophils Absolute: 0.2 10*3/uL (ref 0.0–0.5)
Eosinophils Relative: 4 %
HEMATOCRIT: 41.7 % (ref 38.4–49.9)
HEMOGLOBIN: 13.7 g/dL (ref 13.0–17.1)
LYMPHS ABS: 1.2 10*3/uL (ref 0.9–3.3)
Lymphocytes Relative: 22 %
MCH: 30.3 pg (ref 27.2–33.4)
MCHC: 32.7 g/dL (ref 32.0–36.0)
MCV: 92.6 fL (ref 79.3–98.0)
MONO ABS: 0.5 10*3/uL (ref 0.1–0.9)
MONOS PCT: 10 %
NEUTROS ABS: 3.5 10*3/uL (ref 1.5–6.5)
NEUTROS PCT: 63 %
Platelets: 156 10*3/uL (ref 140–400)
RBC: 4.51 MIL/uL (ref 4.20–5.82)
RDW: 14.4 % (ref 11.0–14.6)
WBC: 5.5 10*3/uL (ref 4.0–10.3)

## 2018-04-02 LAB — LACTATE DEHYDROGENASE: LDH: 128 U/L (ref 98–192)

## 2018-04-02 LAB — URIC ACID: URIC ACID, SERUM: 6.2 mg/dL (ref 3.7–8.6)

## 2018-04-02 LAB — MAGNESIUM: MAGNESIUM: 1.7 mg/dL (ref 1.7–2.4)

## 2018-04-16 LAB — BCR/ABL

## 2018-04-17 ENCOUNTER — Other Ambulatory Visit: Payer: Self-pay | Admitting: Oncology

## 2018-04-17 NOTE — Progress Notes (Signed)
I called correct with his latest results which show a 0.008% detection of his BCR ABL mutation.  This is the lowest is been and is very gratifying.  He is tolerating treatment quite well, tells me he would not know that he had leukemia if we did not test for it  He is also got an excellent exercise program  He knows to call for any other issues that may develop before the next visit.

## 2018-05-15 ENCOUNTER — Other Ambulatory Visit: Payer: Self-pay | Admitting: *Deleted

## 2018-05-15 DIAGNOSIS — C9210 Chronic myeloid leukemia, BCR/ABL-positive, not having achieved remission: Secondary | ICD-10-CM

## 2018-05-15 MED ORDER — NILOTINIB HCL 150 MG PO CAPS: ORAL_CAPSULE | ORAL | 6 refills | Status: DC

## 2018-06-30 ENCOUNTER — Inpatient Hospital Stay: Payer: BLUE CROSS/BLUE SHIELD | Attending: Oncology

## 2018-06-30 DIAGNOSIS — C9210 Chronic myeloid leukemia, BCR/ABL-positive, not having achieved remission: Secondary | ICD-10-CM | POA: Insufficient documentation

## 2018-06-30 DIAGNOSIS — K8500 Idiopathic acute pancreatitis without necrosis or infection: Secondary | ICD-10-CM

## 2018-06-30 DIAGNOSIS — I201 Angina pectoris with documented spasm: Secondary | ICD-10-CM

## 2018-06-30 LAB — CBC WITH DIFFERENTIAL/PLATELET
ABS IMMATURE GRANULOCYTES: 0.02 10*3/uL (ref 0.00–0.07)
BASOS PCT: 1 %
Basophils Absolute: 0.1 10*3/uL (ref 0.0–0.1)
Eosinophils Absolute: 0.3 10*3/uL (ref 0.0–0.5)
Eosinophils Relative: 4 %
HCT: 41.1 % (ref 39.0–52.0)
Hemoglobin: 13.2 g/dL (ref 13.0–17.0)
IMMATURE GRANULOCYTES: 0 %
Lymphocytes Relative: 23 %
Lymphs Abs: 1.8 10*3/uL (ref 0.7–4.0)
MCH: 30.5 pg (ref 26.0–34.0)
MCHC: 32.1 g/dL (ref 30.0–36.0)
MCV: 94.9 fL (ref 80.0–100.0)
Monocytes Absolute: 0.8 10*3/uL (ref 0.1–1.0)
Monocytes Relative: 10 %
NEUTROS PCT: 62 %
Neutro Abs: 4.8 10*3/uL (ref 1.7–7.7)
Platelets: 180 10*3/uL (ref 150–400)
RBC: 4.33 MIL/uL (ref 4.22–5.81)
RDW: 13.7 % (ref 11.5–15.5)
WBC: 7.7 10*3/uL (ref 4.0–10.5)
nRBC: 0 % (ref 0.0–0.2)

## 2018-06-30 LAB — COMPREHENSIVE METABOLIC PANEL
ALBUMIN: 3.9 g/dL (ref 3.5–5.0)
ALK PHOS: 62 U/L (ref 38–126)
ALT: 24 U/L (ref 0–44)
ANION GAP: 7 (ref 5–15)
AST: 23 U/L (ref 15–41)
BILIRUBIN TOTAL: 1.1 mg/dL (ref 0.3–1.2)
BUN: 26 mg/dL — ABNORMAL HIGH (ref 6–20)
CALCIUM: 9.2 mg/dL (ref 8.9–10.3)
CO2: 28 mmol/L (ref 22–32)
CREATININE: 1.12 mg/dL (ref 0.61–1.24)
Chloride: 105 mmol/L (ref 98–111)
GFR calc non Af Amer: >60 mL/min (ref >60)
GLUCOSE: 95 mg/dL (ref 70–99)
Potassium: 4.5 mmol/L (ref 3.5–5.1)
Sodium: 140 mmol/L (ref 135–145)
TOTAL PROTEIN: 6.8 g/dL (ref 6.5–8.1)

## 2018-06-30 LAB — LACTATE DEHYDROGENASE: LDH: 155 U/L (ref 98–192)

## 2018-06-30 LAB — MAGNESIUM: Magnesium: 1.9 mg/dL (ref 1.7–2.4)

## 2018-07-13 NOTE — Progress Notes (Signed)
[facility]  Telephone:(336) [phone] Fax:(336) [phone]     ID: [name] DOB: 1966/08/29  MR#: [id number]  JYN#:[id number]  Patient Care Team: [name] Fess, MD as PCP - General (Family Medicine) Magrinat, Virgie Dad, [name], [name], [name], MD (Inactive) as Consulting Physician (Gastroenterology) Jettie Booze, MD as Consulting Physician (Cardiology) Gloriann Loan, MD as Attending Physician PCP: [name] Fess, MD OTHER MD:  CHIEF COMPLAINT:  Chronic myeloid leukemia  CURRENT TREATMENT:  nilotinib   HISTORY OF PRESENT ILLNESS: From the original intake note:  [name] [name] (the final "e" is pronounced) had a complete blood count as part of his annual physical 10/15/2014 and this showed a white cell count of 7.3, hemoglobin 13.9, and platelets under 96,000. The differential was unremarkable.    more recently, as preop testing for anticipated back surgery he had a repeat CBC yesterday, 08/24/2015. This showed a white cell count of 72.9 thousand.  Hemoglobin and platelet count as well as creatinine were reported as normal. No differential was done. The patient was referred to Dr. Rex Kras who today repeated the CBC, finding a white cell count of 78.2 thousand, hemoglobin 14.2, and platelets 243,000. He requested we evaluate the patient urgently.    [name] [name]'s subsequent history is as detailed below   INTERVAL HISTORY: [name] [name] returns today for follow-up and treatment of his chronic myeloid leukemia. Says he missed a dose here and there but has not missed weeks worth or even a few days worth dosage. He  occasional acne, skin irritation; dry skin and says his eyebrows itch. His concern was that his LDL has risen as side affect from medication, 130 was his last reading. He doesn't want to take a statin.   REVIEW OF  SYSTEMS: [name] [name] reports that for exercise, he run 4 times a week and lifts weight 3-4 times a week. Training for a half marathon and ran 10 miles friday. He wakes up around 5 am every morning. Diet is good but eats too much cheese. 3 or 4 meals a week he eats animal fat. He denies unusual headaches, visual changes, nausea, vomiting, or dizziness. There has been no unusual cough, phlegm production, or pleurisy. This been no change in bowel or bladder habits. She denies unexplained fatigue or unexplained weight loss, bleeding, rash, or fever. A detailed review of systems was otherwise stable.   PAST MEDICAL HISTORY: Past Medical History:  Diagnosis Date  . Arthritis   . Chronic myeloid leukemia ([facility])   . 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 Depression   . GERD (gastroesophageal reflux disease)   . Hyperlipidemia   . No pertinent past medical history   . OSA (obstructive sleep apnea)    mild so patient did not want to be on CPAP  . Sinus disorder     PAST SURGICAL HISTORY: Past Surgical History:  Procedure Laterality Date  . CARDIAC CATHETERIZATION  99,04   both normal, chest pain felt secondary to coronary vasospasm  . HAND / FINGER LESION EXCISION  2009   fx lt metacarpal  . NASAL SEPTOPLASTY W/ TURBINOPLASTY  08/13/2011   Procedure: NASAL SEPTOPLASTY WITH TURBINATE REDUCTION;  Surgeon: Tyson Alias;  Location: [facility];  Service: ENT;  Laterality: N/A;  with LAUP    FAMILY HISTORY No family history on file.  the patient's  father is living, age 85. He had 2 sisters, both with breast cancer diagnosed in their 57s and 96s. The patient's mother died from breast cancer at the age of 7 , it was diagnosed age 47. The patient has one brother and one sister, both in good health. There is no history of hematologic malignancies in the family.  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  He has a child from a first marriage, [name] [name], who lives in [place] and is planning to become a Secretary/administrator. He has been married 12+ years to [name] , and they have a daughter, [name] [name] , 3, at home. The patient attends the [employer] in [place]:  Not in place   HEALTH MAINTENANCE: Social History   Tobacco Use  . Smoking status: Former Research scientist (life sciences)  . Smokeless tobacco: Never Used  Substance Use Topics  . Alcohol use: Yes  . Drug use: No     Colonoscopy: 2010/ Amedeo Plenty  PSA:  Bone density:  Lipid panel:  No Known Allergies  Current Outpatient Medications  Medication Sig Dispense Refill  . ALPRAZolam (XANAX) 0.5 MG tablet Take 0.5 mg by mouth 2 (two) times daily as needed for anxiety.    . diphenhydrAMINE (BENADRYL) 25 MG tablet Take 25 mg by mouth every 6 (six) hours as needed for sleep.    . nilotinib (TASIGNA) 150 MG capsule TAKE 2 CAPSULES (300 MG TOTAL) EVERY 12 HOURS 112 capsule 6  . sildenafil (VIAGRA) 25 MG tablet Take 1 tablet (25 mg total) by mouth daily as needed for erectile dysfunction. 10 tablet 0   No current facility-administered medications for this visit.     OBJECTIVE:  Middle-aged white man   Vitals:   07/14/18 0855  BP: 123/66  Pulse: (!) 52  Resp: 20  Temp: (!) 97.4 F (36.3 C)  SpO2: 100%     Body mass index is 25.23 kg/m.    ECOG FS:0 - Asymptomatic  Sclerae unicteric, pupils round and equal Oropharynx clear and moist No cervical or supraclavicular adenopathy Lungs no rales or rhonchi Heart regular rate and rhythm Abd soft, nontender, positive bowel sounds MSK no focal spinal tenderness, no upper extremity lymphedema Neuro: nonfocal, well oriented, appropriate affect Skin: No obvious lesions   LAB RESULTS: CMP     Component Value Date/Time   NA 140 06/30/2018 1102   NA 140 05/20/2017 0928   K 4.5 06/30/2018 1102   K 4.5 05/20/2017 0928   CL 105 06/30/2018 1102   CO2 28 06/30/2018 1102    CO2 24 05/20/2017 0928   GLUCOSE 95 06/30/2018 1102   GLUCOSE 99 05/20/2017 0928   BUN 26 (H) 06/30/2018 1102   BUN 29.2 (H) 05/20/2017 0928   CREATININE 1.12 06/30/2018 1102   CREATININE 1.1 05/20/2017 0928   CALCIUM 9.2 06/30/2018 1102   CALCIUM 9.7 05/20/2017 0928   PROT 6.8 06/30/2018 1102   PROT 7.0 05/20/2017 0928   ALBUMIN 3.9 06/30/2018 1102   ALBUMIN 3.8 05/20/2017 0928   AST 23 06/30/2018 1102   AST 22 05/20/2017 0928   ALT 24 06/30/2018 1102   ALT 21 05/20/2017 0928   ALKPHOS 62 06/30/2018 1102   ALKPHOS 64 05/20/2017 0928   BILITOT 1.1 06/30/2018 1102   BILITOT 1.29 (H) 05/20/2017 0928   GFRNONAA >60 06/30/2018 1102   GFRAA >60 06/30/2018 1102    INo results found for: SPEP, UPEP  Lab Results  Component Value Date   WBC 7.7 06/30/2018   NEUTROABS 4.8 06/30/2018   HGB 13.2 06/30/2018   HCT 41.1 06/30/2018   MCV 94.9 06/30/2018   PLT 180 06/30/2018      Chemistry      Component Value Date/Time   NA 140 06/30/2018 1102   NA 140 05/20/2017 0928   K 4.5 06/30/2018 1102   K 4.5 05/20/2017 0928   CL 105 06/30/2018 1102   CO2 28 06/30/2018 1102   CO2 24 05/20/2017 0928   BUN 26 (H) 06/30/2018 1102   BUN 29.2 (H) 05/20/2017 0928   CREATININE 1.12 06/30/2018 1102   CREATININE 1.1 05/20/2017 0928      Component Value Date/Time   CALCIUM 9.2 06/30/2018 1102   CALCIUM 9.7 05/20/2017 0928   ALKPHOS 62 06/30/2018 1102   ALKPHOS 64 05/20/2017 0928   AST 23 06/30/2018 1102   AST 22 05/20/2017 0928   ALT 24 06/30/2018 1102   ALT 21 05/20/2017 0928   BILITOT 1.1 06/30/2018 1102   BILITOT 1.29 (H) 05/20/2017 0928       No results found for: LABCA2  No components found for: LABCA125  No results for input(s): INR in the last 168 hours.  Urinalysis No results found for: COLORURINE, APPEARANCEUR, LABSPEC, PHURINE, GLUCOSEU, HGBUR, BILIRUBINUR, KETONESUR, PROTEINUR, UROBILINOGEN, NITRITE, LEUKOCYTESUR  STUDIES: Electrocardiogram today shows sinus  bradycardia and is athletic male, but no evidence of ischemia and in particular no significant QTc prolongation  ASSESSMENT: 52 y.o. [place] man presenting 08/25/2015 with a white cell count of greater than 70,000, primarily myeloid cells, in the absence of any symptoms or signs of infection, with no splenomegaly by exam, basophils 4% and with no blasts seen on the peripheral smear, normal hemoglobin and platelet count  (a) Sokal score 0.64 (low risk)  (b) bone marrow biopsy 09/01/2015 shows 0% blasts  (c) baseline electrocardiogram 11/24/2013 showed a QT/QTc of 424/422  (1) imatinib 400 mg/d started 09/06/2015, discontinued April 2018   (2) bcr/abl1 baseline 08/26/2015 = 116.73%, with WBC 43.7  (a) first month reading ratio 46% with complete hematologic response  (b) second month reading 56% with continuing hematologic response  (c) third month reading 40%   (i) no ABL mutation identified March 2017  (d) fourth month reading 10.9%  (e) imatinib dose increased to 600 mg/day as of 04/02/2016  (f) bcr.abl 6.7% on 04/10/2016  (g) further downward trend interrupted December 2017 with slight rise in bcr.abl %   (h) imatinib dose increased to 400 mg po BID as of 08/10/2016  (i) final reading on imatinib, 12/04/2016, showed a ratio of 0.18  (3) nilotinib started April 2018  (a) baseline QTc 425 on 12/18/2016  (b) QTc 01/31/2017 was 397-408  (c) QT/QTc on 12/30/2017 was 448/387    PLAN: Jacaden has been on nilotinib now a year and a half.  He is very compliant with it, is taking it appropriately, and has had no medication changes since his last visit here.  However there has been a slight increase in his BCR.ABL, now at 0.2 instead of 0.0X.  We are going to repeat that the first week in December just to make sure this is not a lab blip.  If it is indeed a rise I will review his case again with Dr. Royce Macadamia who is our go to a back-up in [facility] and see if we need to go to [place] or a different  medication, check for different mutation, or continue.  In  any case [name] is concerned about the fact that we are not at 0.0000 so that he can try getting off the medication.  We certainly have not reached that level at this point  He is also worried about his LDL which is 130.  We are going to repeat that after he make some dietary changes.  I will call him with those results  Otherwise he is doing terrific.  He knows to call for any other issues that may develop before the next visit here.   Magrinat, Virgie Dad, MD  07/14/18 9:22 AM Medical Oncology and Hematology [facility] [address] [place], [place] [address] Tel. [phone]    Fax. [phone]    Elie Goody, am acting as scribe for Dr. Virgie Dad. Magrinat.  Lindie Spruce MD, have reviewed the above documentation for accuracy and completeness, and I agree with the above. e really when sildenafil is used TID for [facility] and not really for intermittent use.

## 2018-07-14 ENCOUNTER — Inpatient Hospital Stay: Payer: BLUE CROSS/BLUE SHIELD | Attending: Oncology | Admitting: Oncology

## 2018-07-14 ENCOUNTER — Telehealth: Payer: Self-pay | Admitting: Oncology

## 2018-07-14 VITALS — BP 123/66 | HR 52 | Temp 97.4°F | Resp 20 | Ht 74.0 in | Wt 196.5 lb

## 2018-07-14 DIAGNOSIS — Z87891 Personal history of nicotine dependence: Secondary | ICD-10-CM | POA: Diagnosis not present

## 2018-07-14 DIAGNOSIS — Z79899 Other long term (current) drug therapy: Secondary | ICD-10-CM | POA: Insufficient documentation

## 2018-07-14 DIAGNOSIS — C9210 Chronic myeloid leukemia, BCR/ABL-positive, not having achieved remission: Secondary | ICD-10-CM

## 2018-07-14 NOTE — Telephone Encounter (Signed)
Correction - Patient declined print out due to he uses my chart. Patient given appointments for December 2019 and February and March 2020. Dates per patient due to he is out of town the first three weeks in February.

## 2018-07-14 NOTE — Telephone Encounter (Signed)
Gave patient avs report and appointments for December 2019 and February 2020.

## 2018-07-16 LAB — BCR/ABL

## 2018-08-05 ENCOUNTER — Telehealth: Payer: Self-pay | Admitting: Oncology

## 2018-08-05 NOTE — Telephone Encounter (Signed)
Faxed medical records to [facility], Release [phone]

## 2018-08-05 NOTE — Telephone Encounter (Signed)
Faxed medical records to [facility], Release [phone]

## 2018-08-06 ENCOUNTER — Encounter: Payer: Self-pay | Admitting: Oncology

## 2018-08-07 ENCOUNTER — Other Ambulatory Visit: Payer: Self-pay | Admitting: Oncology

## 2018-08-08 ENCOUNTER — Telehealth: Payer: Self-pay | Admitting: Oncology

## 2018-08-08 NOTE — Telephone Encounter (Signed)
Faxed medical records to [facility] at [phone], Release [phone]

## 2018-08-11 ENCOUNTER — Other Ambulatory Visit: Payer: BLUE CROSS/BLUE SHIELD

## 2018-09-16 ENCOUNTER — Other Ambulatory Visit: Payer: Self-pay | Admitting: Oncology

## 2018-10-27 ENCOUNTER — Other Ambulatory Visit: Payer: BLUE CROSS/BLUE SHIELD

## 2018-11-05 ENCOUNTER — Other Ambulatory Visit: Payer: Self-pay | Admitting: Oncology

## 2018-11-05 DIAGNOSIS — C9210 Chronic myeloid leukemia, BCR/ABL-positive, not having achieved remission: Secondary | ICD-10-CM

## 2018-11-06 ENCOUNTER — Ambulatory Visit: Payer: BLUE CROSS/BLUE SHIELD | Admitting: Oncology

## 2018-11-14 ENCOUNTER — Telehealth: Payer: Self-pay | Admitting: *Deleted

## 2018-11-14 NOTE — Telephone Encounter (Signed)
Medical records faxed to [facility] [place]; [place] [phone]

## 2019-01-24 IMAGING — DX DG ABDOMEN 1V
2 series · 2 of 2 positions shown · non-contrast
Comparison: None

CLINICAL DATA: Upper mid abdominal pain for 2 days, history of CML
and splenic artery aneurysm

EXAM:
ABDOMEN - 1 VIEW

[abdomen kub (1 of 2)]
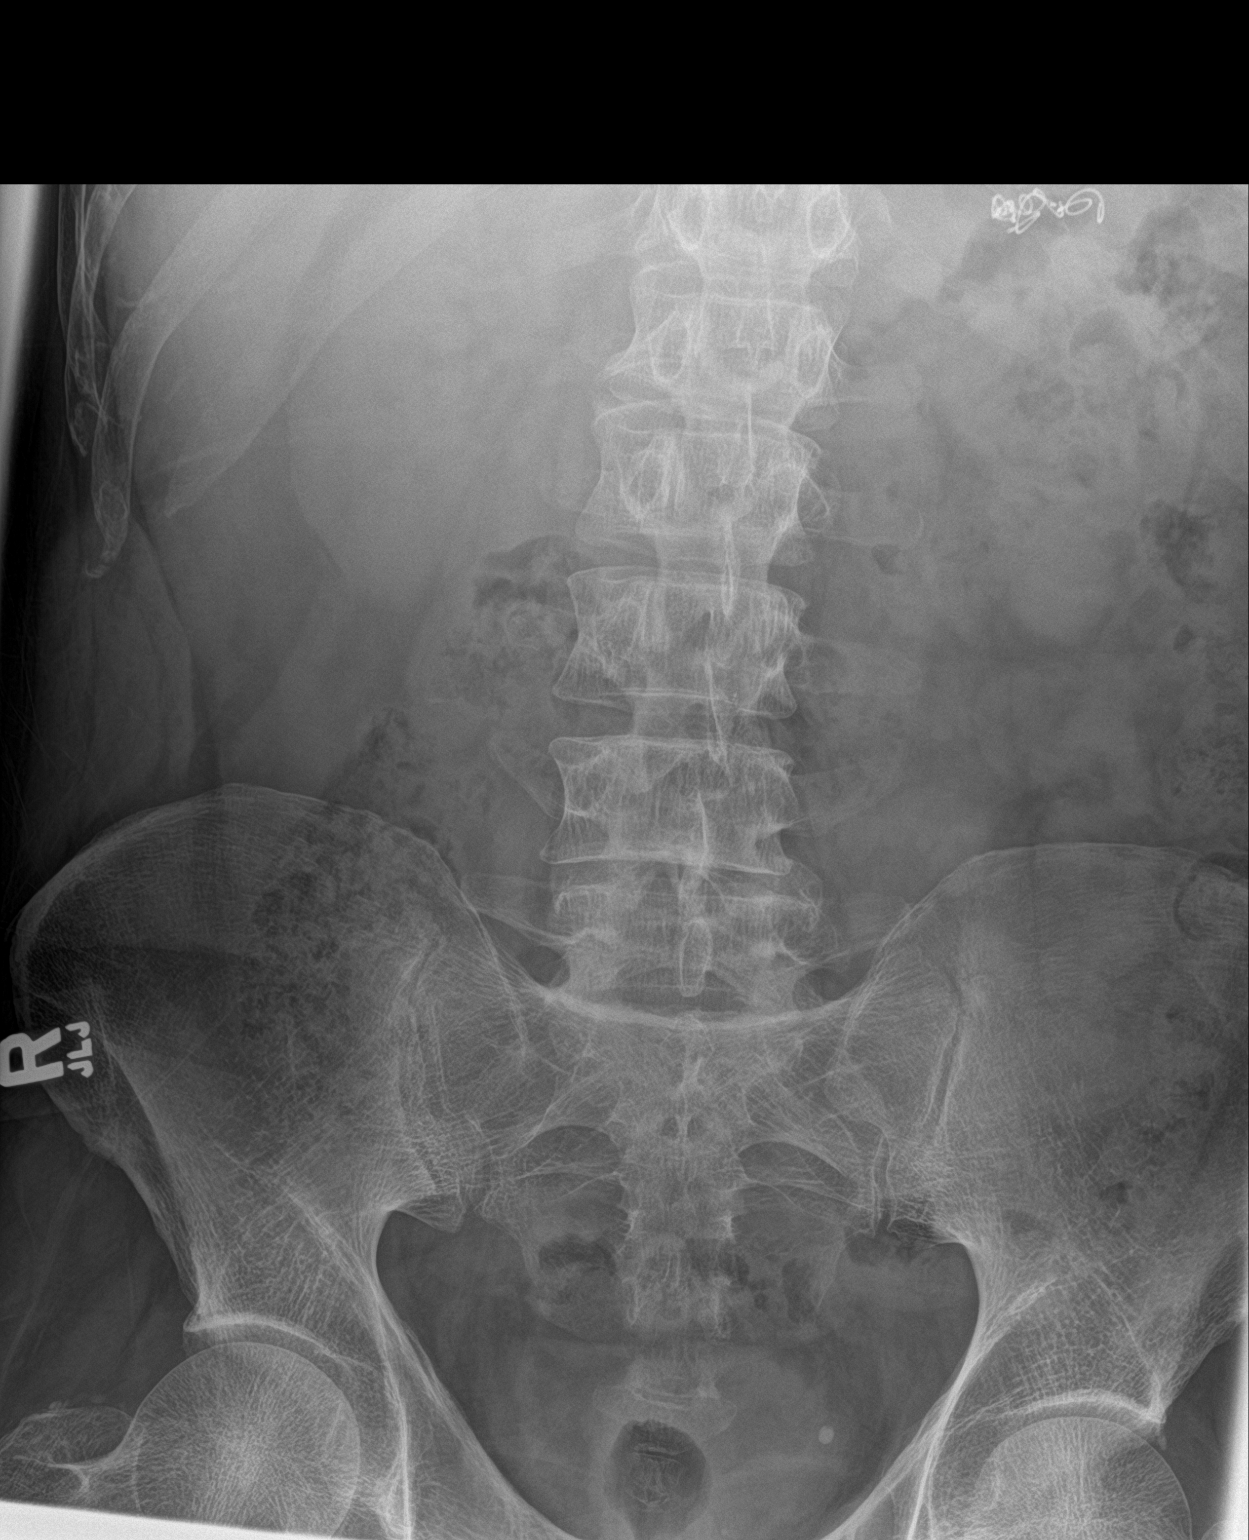

[abdomen kub (2 of 2)]
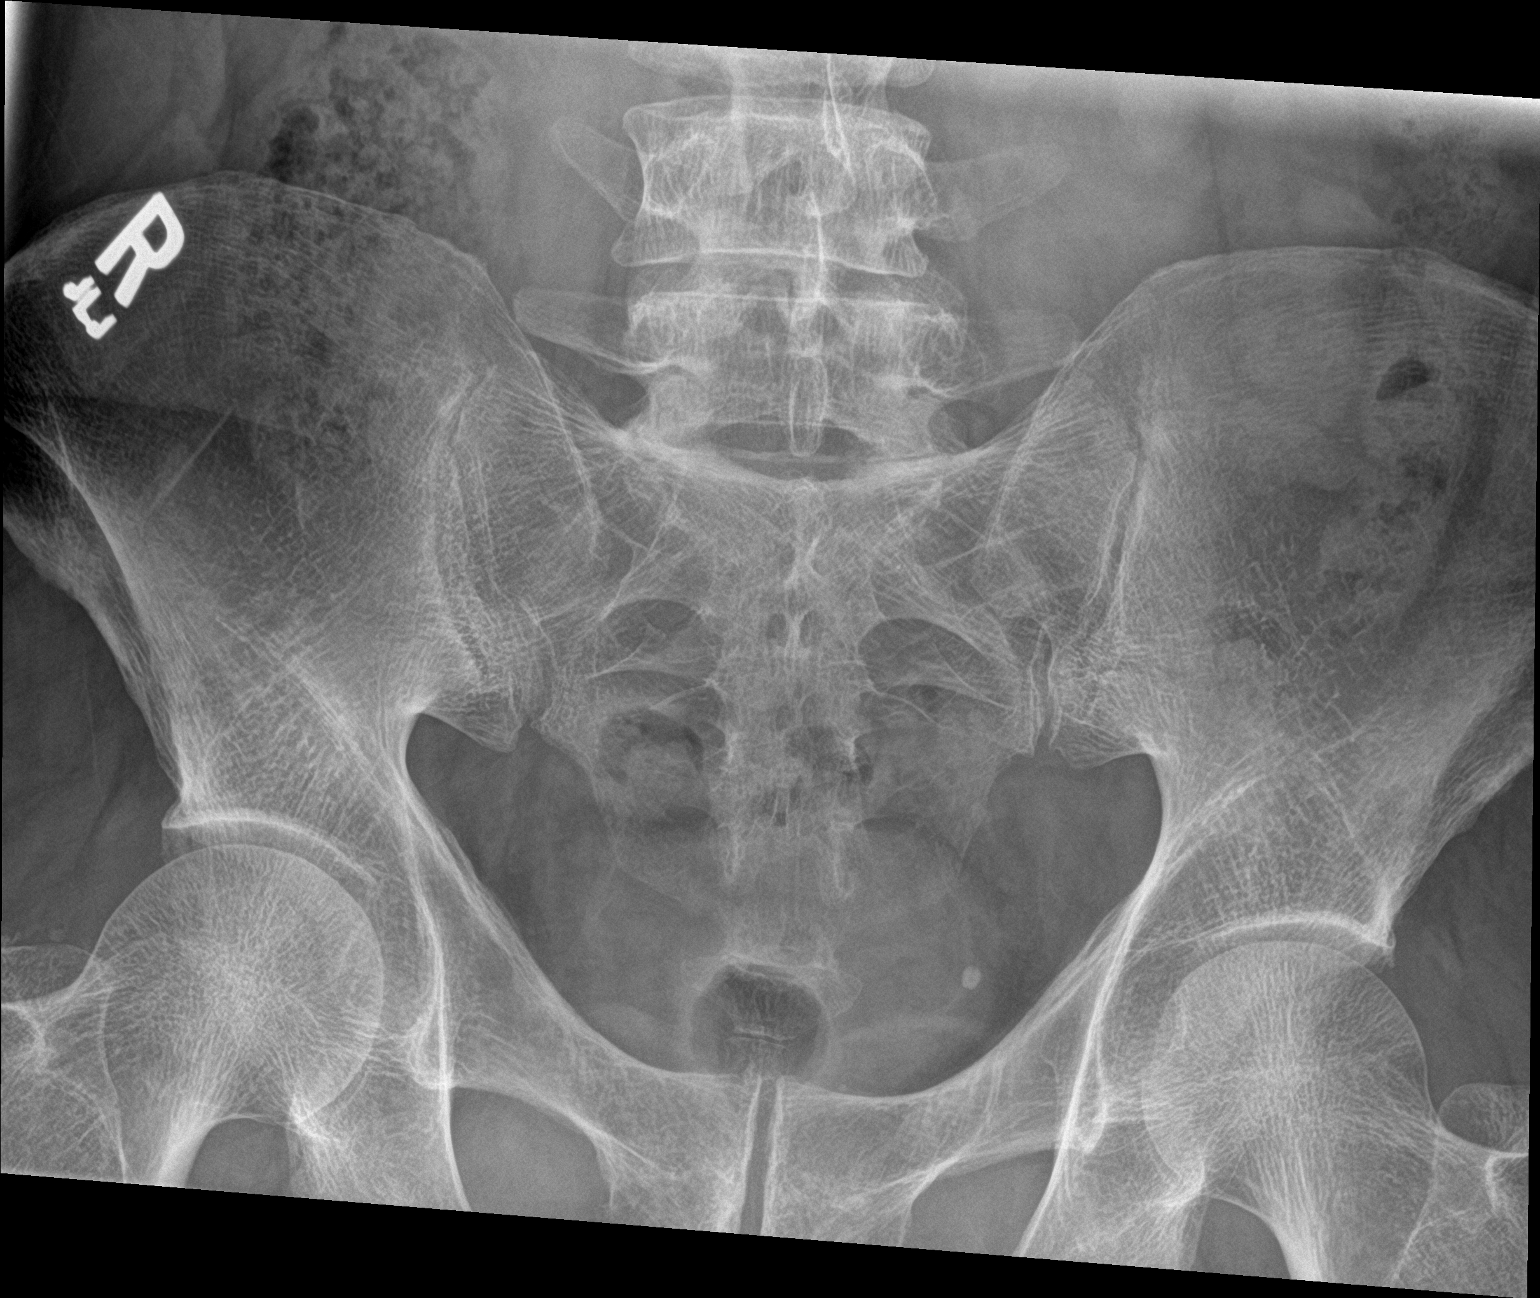

[2 of 2 positions shown; findings below may reference images not displayed]

FINDINGS: Normal bowel gas pattern.

No bowel dilatation or bowel wall thickening.

Coils in LEFT upper quadrant from prior embolization procedure.

Diffuse osseous demineralization.

No urinary tract calcification.

Small LEFT pelvic phlebolith noted.
IMPRESSION: No acute abnormalities.

## 2019-06-02 ENCOUNTER — Inpatient Hospital Stay (HOSPITAL_COMMUNITY)
Admission: AD | Admit: 2019-06-02 | Discharge: 2019-06-04 | DRG: 488 | Disposition: A | Discharge status: Home / Self Care | Payer: BC Managed Care – PPO | Source: Ambulatory Visit | Attending: Student | Admitting: Student

## 2019-06-02 ENCOUNTER — Other Ambulatory Visit: Payer: Self-pay | Admitting: Orthopaedic Surgery

## 2019-06-02 ENCOUNTER — Encounter (HOSPITAL_COMMUNITY): Payer: Self-pay | Admitting: *Deleted

## 2019-06-02 DIAGNOSIS — C9210 Chronic myeloid leukemia, BCR/ABL-positive, not having achieved remission: Secondary | ICD-10-CM | POA: Diagnosis present

## 2019-06-02 DIAGNOSIS — Z20828 Contact with and (suspected) exposure to other viral communicable diseases: Secondary | ICD-10-CM | POA: Diagnosis present

## 2019-06-02 DIAGNOSIS — Z79899 Other long term (current) drug therapy: Secondary | ICD-10-CM

## 2019-06-02 DIAGNOSIS — I2510 Atherosclerotic heart disease of native coronary artery without angina pectoris: Secondary | ICD-10-CM | POA: Diagnosis present

## 2019-06-02 DIAGNOSIS — S83281A Other tear of lateral meniscus, current injury, right knee, initial encounter: Secondary | ICD-10-CM

## 2019-06-02 DIAGNOSIS — Z87891 Personal history of nicotine dependence: Secondary | ICD-10-CM

## 2019-06-02 DIAGNOSIS — F329 Major depressive disorder, single episode, unspecified: Secondary | ICD-10-CM | POA: Diagnosis present

## 2019-06-02 DIAGNOSIS — S82141A Displaced bicondylar fracture of right tibia, initial encounter for closed fracture: Principal | ICD-10-CM | POA: Diagnosis present

## 2019-06-02 DIAGNOSIS — K219 Gastro-esophageal reflux disease without esophagitis: Secondary | ICD-10-CM | POA: Diagnosis present

## 2019-06-02 DIAGNOSIS — Z23 Encounter for immunization: Secondary | ICD-10-CM | POA: Diagnosis not present

## 2019-06-02 DIAGNOSIS — Y92410 Unspecified street and highway as the place of occurrence of the external cause: Secondary | ICD-10-CM | POA: Diagnosis not present

## 2019-06-02 DIAGNOSIS — G4733 Obstructive sleep apnea (adult) (pediatric): Secondary | ICD-10-CM | POA: Diagnosis present

## 2019-06-02 DIAGNOSIS — I252 Old myocardial infarction: Secondary | ICD-10-CM | POA: Diagnosis not present

## 2019-06-02 DIAGNOSIS — D62 Acute posthemorrhagic anemia: Secondary | ICD-10-CM | POA: Diagnosis not present

## 2019-06-02 DIAGNOSIS — S82142A Displaced bicondylar fracture of left tibia, initial encounter for closed fracture: Secondary | ICD-10-CM

## 2019-06-02 DIAGNOSIS — S83261A Peripheral tear of lateral meniscus, current injury, right knee, initial encounter: Secondary | ICD-10-CM | POA: Diagnosis present

## 2019-06-02 DIAGNOSIS — E785 Hyperlipidemia, unspecified: Secondary | ICD-10-CM | POA: Diagnosis present

## 2019-06-02 DIAGNOSIS — Z419 Encounter for procedure for purposes other than remedying health state, unspecified: Secondary | ICD-10-CM

## 2019-06-02 LAB — COMPREHENSIVE METABOLIC PANEL
ALT: 31 U/L (ref 0–44)
AST: 36 U/L (ref 15–41)
Albumin: 3.8 g/dL (ref 3.5–5.0)
Alkaline Phosphatase: 56 U/L (ref 38–126)
Anion gap: 11 (ref 5–15)
BUN: 20 mg/dL (ref 6–20)
CO2: 25 mmol/L (ref 22–32)
Calcium: 9.3 mg/dL (ref 8.9–10.3)
Chloride: 104 mmol/L (ref 98–111)
Creatinine, Ser: 1.13 mg/dL (ref 0.61–1.24)
GFR calc Af Amer: >60 mL/min (ref >60)
GFR calc non Af Amer: >60 mL/min (ref >60)
Glucose, Bld: 174 mg/dL — ABNORMAL HIGH (ref 70–99)
Potassium: 4.2 mmol/L (ref 3.5–5.1)
Sodium: 140 mmol/L (ref 135–145)
Total Bilirubin: 1.3 mg/dL — ABNORMAL HIGH (ref 0.3–1.2)
Total Protein: 6.5 g/dL (ref 6.5–8.1)

## 2019-06-02 LAB — CBC
HCT: 39.8 % (ref 39.0–52.0)
Hemoglobin: 13.4 g/dL (ref 13.0–17.0)
MCH: 31.1 pg (ref 26.0–34.0)
MCHC: 33.7 g/dL (ref 30.0–36.0)
MCV: 92.3 fL (ref 80.0–100.0)
Platelets: 214 10*3/uL (ref 150–400)
RBC: 4.31 MIL/uL (ref 4.22–5.81)
RDW: 13.8 % (ref 11.5–15.5)
WBC: 14.6 10*3/uL — ABNORMAL HIGH (ref 4.0–10.5)
nRBC: 0 % (ref 0.0–0.2)

## 2019-06-02 LAB — SARS CORONAVIRUS 2 BY RT PCR (HOSPITAL ORDER, PERFORMED IN ~~LOC~~ HOSPITAL LAB): SARS Coronavirus 2: NEGATIVE

## 2019-06-02 LAB — PROTIME-INR
INR: 1 (ref 0.8–1.2)
Prothrombin Time: 13.2 seconds (ref 11.4–15.2)

## 2019-06-02 LAB — TYPE AND SCREEN
ABO/RH(D): O NEG
Antibody Screen: NEGATIVE

## 2019-06-02 LAB — ABO/RH: ABO/RH(D): O NEG

## 2019-06-02 LAB — SURGICAL PCR SCREEN
MRSA, PCR: NEGATIVE
Staphylococcus aureus: POSITIVE — AB

## 2019-06-02 LAB — HIV ANTIBODY (ROUTINE TESTING W REFLEX): HIV Screen 4th Generation wRfx: NONREACTIVE

## 2019-06-02 MED ORDER — CELECOXIB 200 MG PO CAPS
200.0000 mg | ORAL_CAPSULE | Freq: Two times a day (BID) | ORAL | Status: DC
  Administered 2019-06-02: 200 mg via ORAL
  Filled 2019-06-02: qty 1

## 2019-06-02 MED ORDER — DOCUSATE SODIUM 100 MG PO CAPS
100.0000 mg | ORAL_CAPSULE | Freq: Two times a day (BID) | ORAL | Status: DC
  Administered 2019-06-02 – 2019-06-04 (×3): 100 mg via ORAL
  Filled 2019-06-02 (×3): qty 1

## 2019-06-02 MED ORDER — OXYCODONE HCL 5 MG PO TABS: 5.0000 mg | ORAL_TABLET | ORAL | Status: DC | PRN

## 2019-06-02 MED ORDER — MUPIROCIN 2 % EX OINT
1.0000 "application " | TOPICAL_OINTMENT | Freq: Two times a day (BID) | CUTANEOUS | Status: DC
  Administered 2019-06-03 – 2019-06-04 (×4): 1 via NASAL
  Filled 2019-06-02 (×2): qty 22

## 2019-06-02 MED ORDER — SENNOSIDES-DOCUSATE SODIUM 8.6-50 MG PO TABS: 1.0000 | ORAL_TABLET | Freq: Every evening | ORAL | Status: DC | PRN

## 2019-06-02 MED ORDER — ACETAMINOPHEN 500 MG PO TABS
1000.0000 mg | ORAL_TABLET | Freq: Three times a day (TID) | ORAL | Status: DC
  Administered 2019-06-02 – 2019-06-03 (×2): 1000 mg via ORAL
  Filled 2019-06-02 (×2): qty 2

## 2019-06-02 MED ORDER — METHOCARBAMOL 500 MG PO TABS
500.0000 mg | ORAL_TABLET | Freq: Four times a day (QID) | ORAL | Status: DC | PRN
  Administered 2019-06-02 – 2019-06-04 (×4): 500 mg via ORAL
  Filled 2019-06-02 (×4): qty 1

## 2019-06-02 MED ORDER — ONDANSETRON HCL 4 MG PO TABS
4.0000 mg | ORAL_TABLET | Freq: Four times a day (QID) | ORAL | Status: DC | PRN
  Administered 2019-06-04: 4 mg via ORAL
  Filled 2019-06-02: qty 1

## 2019-06-02 MED ORDER — CHLORHEXIDINE GLUCONATE CLOTH 2 % EX PADS
6.0000 | MEDICATED_PAD | Freq: Every day | CUTANEOUS | Status: DC
  Administered 2019-06-03 – 2019-06-04 (×2): 6 via TOPICAL

## 2019-06-02 MED ORDER — LACTATED RINGERS IV SOLN
INTRAVENOUS | Status: DC
  Administered 2019-06-02: 21:00:00 via INTRAVENOUS

## 2019-06-02 MED ORDER — ONDANSETRON HCL 4 MG/2ML IJ SOLN: 4.0000 mg | Freq: Four times a day (QID) | INTRAMUSCULAR | Status: DC | PRN

## 2019-06-02 MED ORDER — MORPHINE SULFATE (PF) 2 MG/ML IV SOLN
2.0000 mg | INTRAVENOUS | Status: DC | PRN
  Administered 2019-06-02 – 2019-06-03 (×5): 2 mg via INTRAVENOUS
  Filled 2019-06-02 (×5): qty 1

## 2019-06-02 MED ORDER — DIPHENHYDRAMINE HCL 12.5 MG/5ML PO ELIX
12.5000 mg | ORAL_SOLUTION | ORAL | Status: DC | PRN
Start: 2019-06-02 — End: 2019-06-04

## 2019-06-02 MED ORDER — NILOTINIB HCL 150 MG PO CAPS
300.0000 mg | ORAL_CAPSULE | Freq: Two times a day (BID) | ORAL | Status: DC
  Administered 2019-06-02 – 2019-06-04 (×4): 300 mg via ORAL
  Filled 2019-06-02 (×4): qty 2

## 2019-06-02 MED ORDER — METHOCARBAMOL 1000 MG/10ML IJ SOLN
500.0000 mg | Freq: Four times a day (QID) | INTRAMUSCULAR | Status: DC | PRN
Start: 2019-06-02 — End: 2019-06-04
  Filled 2019-06-02 (×3): qty 5

## 2019-06-02 NOTE — Progress Notes (Unsigned)
Patient to be admitted for surgery urgently tomorrow with Dr. Doreatha Martin.  Formal notes to follow.

## 2019-06-03 ENCOUNTER — Encounter (HOSPITAL_COMMUNITY)
Admission: AD | Disposition: A | Discharge status: Home / Self Care | Payer: Self-pay | Source: Ambulatory Visit | Attending: Orthopaedic Surgery

## 2019-06-03 ENCOUNTER — Inpatient Hospital Stay (HOSPITAL_COMMUNITY): Payer: BC Managed Care – PPO | Admitting: Anesthesiology

## 2019-06-03 ENCOUNTER — Inpatient Hospital Stay (HOSPITAL_COMMUNITY): Payer: BC Managed Care – PPO

## 2019-06-03 ENCOUNTER — Encounter (HOSPITAL_COMMUNITY): Payer: Self-pay | Admitting: *Deleted

## 2019-06-03 ENCOUNTER — Inpatient Hospital Stay (HOSPITAL_COMMUNITY): Admission: RE | Admit: 2019-06-03 | Payer: BC Managed Care – PPO | Source: Home / Self Care | Admitting: Student

## 2019-06-03 HISTORY — PX: ORIF TIBIA PLATEAU: SHX2132

## 2019-06-03 LAB — VITAMIN D 25 HYDROXY (VIT D DEFICIENCY, FRACTURES): Vit D, 25-Hydroxy: 38.65 ng/mL (ref 30–100)

## 2019-06-03 SURGERY — OPEN REDUCTION INTERNAL FIXATION (ORIF) TIBIAL PLATEAU
Anesthesia: General | Site: Leg Upper | Laterality: Right

## 2019-06-03 MED ORDER — PROMETHAZINE HCL 25 MG/ML IJ SOLN: 6.2500 mg | INTRAMUSCULAR | Status: DC | PRN

## 2019-06-03 MED ORDER — ONDANSETRON HCL 4 MG/2ML IJ SOLN
INTRAMUSCULAR | Status: DC | PRN
  Administered 2019-06-03: 4 mg via INTRAVENOUS

## 2019-06-03 MED ORDER — PROPOFOL 10 MG/ML IV BOLUS
INTRAVENOUS | Status: AC
  Filled 2019-06-03: qty 20

## 2019-06-03 MED ORDER — HYDROMORPHONE HCL 1 MG/ML IJ SOLN
INTRAMUSCULAR | Status: AC
  Administered 2019-06-03: 0.5 mg via INTRAVENOUS
  Filled 2019-06-03: qty 1

## 2019-06-03 MED ORDER — CEFAZOLIN SODIUM-DEXTROSE 2-4 GM/100ML-% IV SOLN
2.0000 g | Freq: Three times a day (TID) | INTRAVENOUS | Status: DC
  Administered 2019-06-03 – 2019-06-04 (×2): 2 g via INTRAVENOUS
  Filled 2019-06-03 (×2): qty 100

## 2019-06-03 MED ORDER — INFLUENZA VAC SPLIT QUAD 0.5 ML IM SUSY
0.5000 mL | PREFILLED_SYRINGE | INTRAMUSCULAR | Status: AC
  Administered 2019-06-04: 0.5 mL via INTRAMUSCULAR
  Filled 2019-06-03: qty 0.5

## 2019-06-03 MED ORDER — ROCURONIUM BROMIDE 10 MG/ML (PF) SYRINGE
PREFILLED_SYRINGE | INTRAVENOUS | Status: AC
  Filled 2019-06-03: qty 10

## 2019-06-03 MED ORDER — FENTANYL CITRATE (PF) 100 MCG/2ML IJ SOLN
INTRAMUSCULAR | Status: DC | PRN
  Administered 2019-06-03 (×3): 50 ug via INTRAVENOUS
  Administered 2019-06-03: 100 ug via INTRAVENOUS

## 2019-06-03 MED ORDER — ACETAMINOPHEN 500 MG PO TABS
1000.0000 mg | ORAL_TABLET | Freq: Four times a day (QID) | ORAL | Status: DC
  Administered 2019-06-03 – 2019-06-04 (×4): 1000 mg via ORAL
  Filled 2019-06-03 (×4): qty 2

## 2019-06-03 MED ORDER — HYDROMORPHONE HCL 1 MG/ML IJ SOLN
0.2500 mg | INTRAMUSCULAR | Status: DC | PRN
  Administered 2019-06-03 (×4): 0.5 mg via INTRAVENOUS

## 2019-06-03 MED ORDER — HYDROMORPHONE HCL 1 MG/ML IJ SOLN
INTRAMUSCULAR | Status: AC
  Administered 2019-06-03: 18:00:00 0.5 mg via INTRAVENOUS
  Filled 2019-06-03: qty 1

## 2019-06-03 MED ORDER — LACTATED RINGERS IV SOLN
INTRAVENOUS | Status: DC | PRN
  Administered 2019-06-03: 15:00:00 via INTRAVENOUS

## 2019-06-03 MED ORDER — GABAPENTIN 100 MG PO CAPS
100.0000 mg | ORAL_CAPSULE | Freq: Three times a day (TID) | ORAL | Status: DC
  Administered 2019-06-03 – 2019-06-04 (×2): 100 mg via ORAL
  Filled 2019-06-03 (×2): qty 1

## 2019-06-03 MED ORDER — ALPRAZOLAM 0.25 MG PO TABS: 0.2500 mg | ORAL_TABLET | Freq: Two times a day (BID) | ORAL | Status: DC | PRN

## 2019-06-03 MED ORDER — ONDANSETRON HCL 4 MG/2ML IJ SOLN
INTRAMUSCULAR | Status: AC
  Filled 2019-06-03: qty 2

## 2019-06-03 MED ORDER — CEFAZOLIN SODIUM-DEXTROSE 2-3 GM-%(50ML) IV SOLR
INTRAVENOUS | Status: DC | PRN
  Administered 2019-06-03: 2 g via INTRAVENOUS

## 2019-06-03 MED ORDER — HYDROMORPHONE HCL 1 MG/ML IJ SOLN
INTRAMUSCULAR | Status: DC | PRN
  Administered 2019-06-03 (×2): 0.5 mg via INTRAVENOUS

## 2019-06-03 MED ORDER — MORPHINE SULFATE (PF) 2 MG/ML IV SOLN
2.0000 mg | INTRAVENOUS | Status: DC | PRN
  Administered 2019-06-03 – 2019-06-04 (×4): 2 mg via INTRAVENOUS
  Filled 2019-06-03 (×4): qty 1

## 2019-06-03 MED ORDER — MIDAZOLAM HCL 5 MG/5ML IJ SOLN
INTRAMUSCULAR | Status: DC | PRN
  Administered 2019-06-03: 2 mg via INTRAVENOUS

## 2019-06-03 MED ORDER — MIDAZOLAM HCL 2 MG/2ML IJ SOLN
INTRAMUSCULAR | Status: AC
  Filled 2019-06-03: qty 2

## 2019-06-03 MED ORDER — FENTANYL CITRATE (PF) 250 MCG/5ML IJ SOLN
INTRAMUSCULAR | Status: AC
  Filled 2019-06-03: qty 5

## 2019-06-03 MED ORDER — PROPOFOL 10 MG/ML IV BOLUS
INTRAVENOUS | Status: DC | PRN
  Administered 2019-06-03: 160 mg via INTRAVENOUS
  Administered 2019-06-03: 40 mg via INTRAVENOUS

## 2019-06-03 MED ORDER — LACTATED RINGERS IV SOLN
INTRAVENOUS | Status: DC
  Administered 2019-06-03 (×2): via INTRAVENOUS

## 2019-06-03 MED ORDER — 0.9 % SODIUM CHLORIDE (POUR BTL) OPTIME
TOPICAL | Status: DC | PRN
  Administered 2019-06-03: 15:00:00 1000 mL

## 2019-06-03 MED ORDER — SUGAMMADEX SODIUM 200 MG/2ML IV SOLN
INTRAVENOUS | Status: DC | PRN
  Administered 2019-06-03: 200 mg via INTRAVENOUS

## 2019-06-03 MED ORDER — ACETAMINOPHEN 500 MG PO TABS: 1000.0000 mg | ORAL_TABLET | Freq: Once | ORAL | Status: DC

## 2019-06-03 MED ORDER — ROCURONIUM BROMIDE 50 MG/5ML IV SOSY
PREFILLED_SYRINGE | INTRAVENOUS | Status: DC | PRN
  Administered 2019-06-03: 30 mg via INTRAVENOUS
  Administered 2019-06-03 (×2): 20 mg via INTRAVENOUS
  Administered 2019-06-03: 50 mg via INTRAVENOUS
  Administered 2019-06-03: 20 mg via INTRAVENOUS

## 2019-06-03 MED ORDER — LIDOCAINE 2% (20 MG/ML) 5 ML SYRINGE
INTRAMUSCULAR | Status: DC | PRN
  Administered 2019-06-03: 60 mg via INTRAVENOUS

## 2019-06-03 MED ORDER — ENOXAPARIN SODIUM 40 MG/0.4ML ~~LOC~~ SOLN
40.0000 mg | SUBCUTANEOUS | Status: DC
  Administered 2019-06-04: 08:00:00 40 mg via SUBCUTANEOUS
  Filled 2019-06-03: qty 0.4

## 2019-06-03 MED ORDER — VANCOMYCIN HCL 1000 MG IV SOLR
INTRAVENOUS | Status: DC | PRN
  Administered 2019-06-03: 1000 mg

## 2019-06-03 MED ORDER — DEXAMETHASONE SODIUM PHOSPHATE 10 MG/ML IJ SOLN
INTRAMUSCULAR | Status: DC | PRN
  Administered 2019-06-03: 4 mg via INTRAVENOUS

## 2019-06-03 SURGICAL SUPPLY — 89 items
BANDAGE ESMARK 6X9 LF (GAUZE/BANDAGES/DRESSINGS) ×1 IMPLANT
BIT DRILL 2.0 QC 140 STER (BIT) ×3 IMPLANT
BIT DRILL 2.5 QC 240 STER (BIT) ×3 IMPLANT
BIT DRILL 2.8 QC 200 STER (BIT) ×3 IMPLANT
BLADE CLIPPER SURG (BLADE) IMPLANT
BLADE SURG 15 STRL LF DISP TIS (BLADE) ×1 IMPLANT
BLADE SURG 15 STRL SS (BLADE) ×2
BNDG ELASTIC 4X5.8 VLCR STR LF (GAUZE/BANDAGES/DRESSINGS) ×3 IMPLANT
BNDG ELASTIC 6X10 VLCR STRL LF (GAUZE/BANDAGES/DRESSINGS) ×3 IMPLANT
BNDG ELASTIC 6X5.8 VLCR STR LF (GAUZE/BANDAGES/DRESSINGS) ×3 IMPLANT
BNDG ESMARK 6X9 LF (GAUZE/BANDAGES/DRESSINGS) ×3
BNDG GAUZE ELAST 4 BULKY (GAUZE/BANDAGES/DRESSINGS) ×3 IMPLANT
BONE CHIP PRESERV 40CC PCAN1/2 (Bone Implant) ×3 IMPLANT
BRUSH SCRUB EZ PLAIN DRY (MISCELLANEOUS) ×6 IMPLANT
CANISTER SUCT 3000ML PPV (MISCELLANEOUS) ×3 IMPLANT
CHLORAPREP W/TINT 26 (MISCELLANEOUS) ×6 IMPLANT
CLOSURE STERI-STRIP 1/2X4 (GAUZE/BANDAGES/DRESSINGS) ×1
CLSR STERI-STRIP ANTIMIC 1/2X4 (GAUZE/BANDAGES/DRESSINGS) ×2 IMPLANT
COVER SURGICAL LIGHT HANDLE (MISCELLANEOUS) ×3 IMPLANT
COVER WAND RF STERILE (DRAPES) ×3 IMPLANT
CUFF TOURN SGL QUICK 34 (TOURNIQUET CUFF) ×2
CUFF TRNQT CYL 34X4.125X (TOURNIQUET CUFF) ×1 IMPLANT
DRAPE C-ARM 42X72 X-RAY (DRAPES) ×3 IMPLANT
DRAPE C-ARMOR (DRAPES) ×3 IMPLANT
DRAPE ORTHO SPLIT 77X108 STRL (DRAPES) ×4
DRAPE SURG ORHT 6 SPLT 77X108 (DRAPES) ×2 IMPLANT
DRAPE U-SHAPE 47X51 STRL (DRAPES) ×3 IMPLANT
DRSG PAD ABDOMINAL 8X10 ST (GAUZE/BANDAGES/DRESSINGS) ×3 IMPLANT
ELECT REM PT RETURN 9FT ADLT (ELECTROSURGICAL) ×3
ELECTRODE REM PT RTRN 9FT ADLT (ELECTROSURGICAL) ×1 IMPLANT
GAUZE SPONGE 4X4 12PLY STRL (GAUZE/BANDAGES/DRESSINGS) ×3 IMPLANT
GAUZE SPONGE 4X4 12PLY STRL LF (GAUZE/BANDAGES/DRESSINGS) ×3 IMPLANT
GLOVE BIO SURGEON STRL SZ 6.5 (GLOVE) ×6 IMPLANT
GLOVE BIO SURGEON STRL SZ7.5 (GLOVE) ×12 IMPLANT
GLOVE BIO SURGEONS STRL SZ 6.5 (GLOVE) ×3
GLOVE BIOGEL PI IND STRL 6.5 (GLOVE) ×1 IMPLANT
GLOVE BIOGEL PI IND STRL 7.5 (GLOVE) ×1 IMPLANT
GLOVE BIOGEL PI INDICATOR 6.5 (GLOVE) ×2
GLOVE BIOGEL PI INDICATOR 7.5 (GLOVE) ×2
GOWN STRL REUS W/ TWL LRG LVL3 (GOWN DISPOSABLE) ×2 IMPLANT
GOWN STRL REUS W/TWL LRG LVL3 (GOWN DISPOSABLE) ×4
IMMOBILIZER KNEE 22 UNIV (SOFTGOODS) ×3 IMPLANT
K-WIRE 1.6X150 (WIRE) ×6
KIT BASIN OR (CUSTOM PROCEDURE TRAY) ×3 IMPLANT
KIT TURNOVER KIT B (KITS) ×3 IMPLANT
KWIRE 1.6X150 (WIRE) ×2 IMPLANT
NDL SUT 6 .5 CRC .975X.05 MAYO (NEEDLE) ×2 IMPLANT
NEEDLE MAYO TAPER (NEEDLE) ×4
NEEDLE MAYO TROCAR (NEEDLE) ×3 IMPLANT
NS IRRIG 1000ML POUR BTL (IV SOLUTION) ×3 IMPLANT
PACK TOTAL JOINT (CUSTOM PROCEDURE TRAY) ×3 IMPLANT
PAD ARMBOARD 7.5X6 YLW CONV (MISCELLANEOUS) ×6 IMPLANT
PAD CAST 4YDX4 CTTN HI CHSV (CAST SUPPLIES) ×1 IMPLANT
PADDING CAST COTTON 4X4 STRL (CAST SUPPLIES) ×2
PADDING CAST COTTON 6X4 STRL (CAST SUPPLIES) ×3 IMPLANT
PASSER SUT SWANSON 36MM LOOP (INSTRUMENTS) ×3 IMPLANT
PLATE TIBIA 3.5 VA-LCP 8H RT S (Plate) ×3 IMPLANT
RETRIEVER SUT HEWSON (MISCELLANEOUS) ×3 IMPLANT
SCREW 2.7X60MM (Screw) ×3 IMPLANT
SCREW CORT 3.5X42M SELF TAP (Screw) ×3 IMPLANT
SCREW CORTEX 2.7X50 SLF TPNG (Screw) ×3 IMPLANT
SCREW CORTEX 3.5 38MM (Screw) ×2 IMPLANT
SCREW CORTEX 3.5 40MM (Screw) ×2 IMPLANT
SCREW HEADED ST 3.5X85 (Screw) ×3 IMPLANT
SCREW LOCK 3.5X85 (Screw) ×3 IMPLANT
SCREW LOCK CORT ST 3.5X38 (Screw) ×1 IMPLANT
SCREW LOCK CORT ST 3.5X40 (Screw) ×1 IMPLANT
SCREW LOCKING 3.5X80MM VA (Screw) ×3 IMPLANT
SCREW LOCKING VA 3.5X85MM (Screw) ×6 IMPLANT
STAPLER VISISTAT 35W (STAPLE) ×3 IMPLANT
SUCTION FRAZIER HANDLE 10FR (MISCELLANEOUS) ×2
SUCTION TUBE FRAZIER 10FR DISP (MISCELLANEOUS) ×1 IMPLANT
SUT ETHILON 2 0 FS 18 (SUTURE) ×3 IMPLANT
SUT ETHILON 3 0 PS 1 (SUTURE) IMPLANT
SUT FIBERWIRE #2 38 T-5 BLUE (SUTURE)
SUT MON AB 3-0 SH 27 (SUTURE) ×4
SUT MON AB 3-0 SH27 (SUTURE) ×2 IMPLANT
SUT VIC AB 0 CT1 27 (SUTURE) ×4
SUT VIC AB 0 CT1 27XBRD ANBCTR (SUTURE) ×2 IMPLANT
SUT VIC AB 1 CT1 18XCR BRD 8 (SUTURE) IMPLANT
SUT VIC AB 1 CT1 27 (SUTURE) ×2
SUT VIC AB 1 CT1 27XBRD ANBCTR (SUTURE) ×1 IMPLANT
SUT VIC AB 1 CT1 8-18 (SUTURE)
SUT VIC AB 2-0 CT1 27 (SUTURE) ×4
SUT VIC AB 2-0 CT1 TAPERPNT 27 (SUTURE) ×2 IMPLANT
SUTURE FIBERWR #2 38 T-5 BLUE (SUTURE) IMPLANT
TOWEL GREEN STERILE (TOWEL DISPOSABLE) ×6 IMPLANT
TRAY FOLEY MTR SLVR 16FR STAT (SET/KITS/TRAYS/PACK) IMPLANT
WATER STERILE IRR 1000ML POUR (IV SOLUTION) ×6 IMPLANT

## 2019-06-03 NOTE — Transfer of Care (Signed)
Immediate Anesthesia Transfer of Care Note  Patient: [name] [name]  Procedure(s) Performed: OPEN REDUCTION INTERNAL FIXATION (ORIF) TIBIAL PLATEAU (Right Leg Upper)  Patient Location: PACU  Anesthesia Type:General  Level of Consciousness: awake and alert   Airway & Oxygen Therapy: Patient Spontanous Breathing  Post-op Assessment: Report given to RN and Post -op Vital signs reviewed and stable  Post vital signs: Reviewed and stable  Last Vitals:  Vitals Value Taken Time  BP 187/63 06/03/19 1752  Temp    Pulse 85 06/03/19 1754  Resp 21 06/03/19 1754  SpO2 97 % 06/03/19 1754  Vitals shown include unvalidated device data.  Last Pain:  Vitals:   06/03/19 1249  TempSrc: Oral  PainSc:       Patients Stated Pain Goal: 3 ([id number])  Complications: No apparent anesthesia complications

## 2019-06-03 NOTE — Anesthesia Preprocedure Evaluation (Signed)
Anesthesia Evaluation  Patient identified by MRN, date of birth, ID band Patient awake    Reviewed: Allergy & Precautions, NPO status , Patient's Chart, lab work & pertinent test results  Airway Mallampati: II  TM Distance: >3 FB     Dental  (+) Dental Advisory Given   Pulmonary sleep apnea , former smoker,    breath sounds clear to auscultation       Cardiovascular + CAD   Rhythm:Regular Rate:Normal     Neuro/Psych negative neurological ROS     GI/Hepatic Neg liver ROS, GERD  ,  Endo/Other  negative endocrine ROS  Renal/GU negative Renal ROS     Musculoskeletal  (+) Arthritis ,   Abdominal   Peds  Hematology negative hematology ROS (+)   Anesthesia Other Findings   Reproductive/Obstetrics                             Lab Results  Component Value Date   WBC 14.6 (H) 06/02/2019   HGB 13.4 06/02/2019   HCT 39.8 06/02/2019   MCV 92.3 06/02/2019   PLT 214 06/02/2019   Lab Results  Component Value Date   CREATININE 1.13 06/02/2019   BUN 20 06/02/2019   NA 140 06/02/2019   K 4.2 06/02/2019   CL 104 06/02/2019   CO2 25 06/02/2019    Anesthesia Physical Anesthesia Plan  ASA: II  Anesthesia Plan: General   Post-op Pain Management:    Induction: Intravenous  PONV Risk Score and Plan: 2 and Dexamethasone, Ondansetron and Treatment may vary due to age or medical condition  Airway Management Planned: Oral ETT  Additional Equipment:   Intra-op Plan:   Post-operative Plan: Extubation in OR  Informed Consent: I have reviewed the patients History and Physical, chart, labs and discussed the procedure including the risks, benefits and alternatives for the proposed anesthesia with the patient or authorized representative who has indicated his/her understanding and acceptance.     Dental advisory given  Plan Discussed with: CRNA  Anesthesia Plan Comments:          Anesthesia Quick Evaluation

## 2019-06-03 NOTE — Anesthesia Postprocedure Evaluation (Signed)
Anesthesia Post Note  Patient: [name] [name]  Procedure(s) Performed: OPEN REDUCTION INTERNAL FIXATION (ORIF) TIBIAL PLATEAU (Right Leg Upper)     Patient location during evaluation: PACU Anesthesia Type: General Level of consciousness: awake and alert Pain management: pain level controlled Vital Signs Assessment: post-procedure vital signs reviewed and stable Respiratory status: spontaneous breathing, nonlabored ventilation, respiratory function stable and patient connected to nasal cannula oxygen Cardiovascular status: blood pressure returned to baseline and stable Postop Assessment: no apparent nausea or vomiting Anesthetic complications: no    Last Vitals:  Vitals:   06/03/19 1825 06/03/19 1830  BP: (!) 156/76   Pulse: 74 73  Resp: (!) 25 14  Temp: 37 C   SpO2: 96% 96%    Last Pain:  Vitals:   06/03/19 1830  TempSrc:   PainSc: 8                  Gila Lauf
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
## 2019-06-03 NOTE — Op Note (Signed)
Orthopaedic Surgery Operative Note (CSN: [id number] ) Date of Surgery: 06/03/2019  Admit Date: 06/02/2019   Diagnoses: Pre-Op Diagnoses: Right lateral tibial plateau fracture   Post-Op Diagnosis: Right lateral tibial plateau fracture  Right lateral meniscus tear Right avulsion of ACL tuberosity  Procedures: 1. CPT 27535-Open reduction internal fixation of right lateral tibial plateau fracture 2. CPT 27403-Repair of right lateral meniscus 3. CPT 27540-Open reduction internal fixation of right tibial tuberosity  Surgeons : Primary: Shona Needles, MD  Assistant: Patrecia Pace, PA-C  Location: OR 7   Anesthesia:General  Antibiotics: Ancef 2g preop   Tourniquet time: Total Tourniquet Time Documented: Thigh (Right) - 107 minutes Total: Thigh (Right) - 107 minutes  Estimated Blood 123456 mL  Complications:None   Specimens:None   Implants: Implant Name Type Inv. Item Serial No. Manufacturer Lot No. LRB No. Used Action  BONE CHIP PRESERV 40CC - [id number] Bone Implant BONE CHIP PRESERV 40CC [id number] [employer]  Right 1 Implanted  SCREW HEADED ST 3.5X85 - [id number] Screw SCREW HEADED ST 3.5X85  [employer]  Right 1 Implanted  PLATE PROX TIBIA VA LCP 3.5MM - [id number] Plate PLATE PROX TIBIA VA LCP 3.5MM  [employer]  Right 1 Implanted  SCREW LOCK 3.5X85 - [id number] Screw SCREW LOCK 3.5X85  [employer]  Right 1 Implanted  SCREW CORTEX 3.5 40MM - [id number] Screw SCREW CORTEX 3.5 40MM  [employer]  Right 1 Implanted  SCREW CORTEX 3.5 38MM - [id number] Screw SCREW CORTEX 3.5 38MM  [employer]  Right 1 Implanted  SCREW CORT 3.5X42M SELF TAP - [id number] Screw SCREW CORT 3.5X42M SELF TAP  [employer]  Right 1 Implanted  SCREW LOCKING VA 3.5X85MM - [id number] Screw SCREW LOCKING VA 3.5X85MM  [employer]  Right 2 Implanted  SCREW LOCKING 3.5X80MM VA - [id number] Screw SCREW LOCKING 3.5X80MM VA  [employer]  Right 1 Implanted     Indications  for Surgery: 53 year old male with a history of CML with a right tibial plateau fracture. With the amount of displacement and his high level of activity I recommend proceeding with open reduction internal fixation.  I discussed risks and benefits with the patient. Risks discussed included bleeding requiring blood transfusion, bleeding causing a hematoma, infection, malunion, nonunion, damage to surrounding nerves and blood vessels, pain, hardware prominence or irritation, hardware failure, stiffness, post-traumatic arthritis, DVT/PE, compartment syndrome, and even anesthetic complications.  The patient agreed to proceed with surgery.  Operative Findings: 1.  Significantly displaced tibial plateau fracture with impaction of the lateral joint with avulsion of anterior tibial tuberosity and the footprint of the ACL and lateral meniscus. 2.  Open reduction internal fixation of right tibial plateau fracture using Synthes VA 3.5 mm LCP proximal tibial locking plate.  Repair of ACL stump using #2 FiberWire. 3.  Right peripheral lateral meniscus tear treated with repair using #2 FiberWire  Procedure: The patient was identified in the preoperative holding area. Consent was confirmed with the patient and their family and all questions were answered. The operative extremity was marked after confirmation with the patient. he was then brought back to the operating room by our anesthesia colleagues.  He was placed under general anesthetic and carefully transferred over to a radiolucent flat top table.  A bump was placed under his operative hip.  A nonsterile tourniquet was placed to his upper thigh. The operative extremity was then prepped and draped in usual sterile fashion. A preoperative timeout was performed to verify the patient, the procedure, and  the extremity. Preoperative antibiotics were dosed.  Fluoroscopic imaging was used to identify the fracture.  There was significant lateral joint instability with a  significant valgus instability as well.  An Esmarch was used to exsanguinate the leg elevated tourniquet to 300 mmHg.  A standard lateral parapatellar approach was used.  Is carried down through skin and subcutaneous tissue.  The fascia and IT band in line with the incision was split and the IT band was reflected off the lateral tibial plateau using subperiosteal dissection.  The interval between the IT band and the capsule was developed.  I developed this all the way back to the fibular head.  I split the anterior compartment fascia.  A sub-meniscal arthrotomy was performed using a 15 blade.  #1 Vicryl sutures were used to tag the capsule and retract this out of the way.  Here encountered a peripheral meniscus tear.  I also encountered the avulsion fragments of the ACL footprint as well as the insertion of the anterior horn of the lateral meniscus.  #2 FiberWire was used in a horizontal mattress fashion to placed through the peripheral border of the meniscus.  These will be used later to repair the meniscus back to the capsule.  #2 FiberWire was then used to placed through the ACL stump and the comminuted fragments that were attached to it.  These were tagged with a hemostat for later repair.  Once the meniscus repair sutures were placed a I turned my attention to the articular surface.  There was a significant involvement of lateral plateau.  There was a free osteochondral fragment that was depressed down at least 1 cm.  There is also some impaction of the lateral plateau that was elevated with a Cobb elevator and held provisionally with K wires.  The free osteochondral fragment was elevated back up to the articular surface and an anatomic reduction was able to be obtained with the more posterior lateral aspect of the joint.  This was held provisionally with a K wire.  The lateral split of the plateau was then reduced. A percutaneous incision was made along the medial plateau and a reduction tenaculum was used  to restore the condylar width and hold the reduction.  A 3.5 mm nonlocking screw was used to provisionally hold the the condylar reduction.  The clamp was removed and the K wires remained in place.  I then chose an 8 hole Synthes 3.5 mm LCP proximal tibial locking plate.  It was slid submuscularly along the lateral border of the tibia.  It was held provisionally with a K wire proximally and fluoroscopic imaging was used to confirm adequate placement on the lateral view.  A nonlocking screw was placed in the proximal segment.  Percutaneous incisions were made to bring the plate flush to bone along the lateral cortex of the tibial shaft.  Once the plate was flush against the bone I then returned to the proximal segment and placed 3 locking screws in the proximal articular block.  This rafted the impaction of the articular surface.  I removed the K wires and there was still some residual motion of that free articular fragment which only had a few millimeters of the subchondral bone on it.  As result I felt like there needed to be independent fixation of this fragment to prevent impaction and loss of articular reduction.  The reduction was obtained again and held provisionally with K wires.  I then placed 2.7 mm nonlocking screws to raft the osteochondral fragment  gaining good purchase in the metaphysis of the tibia.  Final fluoroscopic images were obtained.  Excellent reduction of the joint was visualized.  The tourniquet was deflated.  The incision was copiously irrigated.  I then drilled through the plate and I used a suture passer to bring the ACL stump sutures through the plate and tied these down to repair the footprint of the ACL and anterior horn of the lateral meniscus.  I then used my Vicryl tag stitches and brought these through the plate and tied these down to repair the capsule.  I then tied down my FiberWire meniscus repair sutures to complete the meniscus repair.  A gram of vancomycin powder was  placed into the incision.  The IT band fascia was closed with 0 Vicryl suture.  The skin was closed with 2-0 Vicryl and 3-0 Monocryl.  A sterile dressing consisting of Steri-Strips, 4 x 4's sterile cast padding and Ace wrap was placed.  He was then placed back in his knee immobilizer.  He was awoken from anesthesia and taken to the PACU in stable condition.  Post Op Plan/Instructions: The patient will be nonweightbearing to the right lower extremity.  He will be fitted for a hinged knee brace to allow unrestricted range of motion starting on postoperative day 1.  He will receive postoperative Ancef.  He will receive Lovenox while he is in the hospital and be discharged home on aspirin 325 mg.  He will be mobilized with physical and Occupational Therapy.  I was present and performed the entire surgery.  Patrecia Pace, PA-C did assist me throughout the case. An assistant was necessary given the difficulty in approach, maintenance of reduction and ability to instrument the fracture.   Katha Hamming, MD [facility]

## 2019-06-03 NOTE — Progress Notes (Signed)
Report given to short stay, pt ready to go down to OR. [place], RN 06/03/2019 1:05 PM

## 2019-06-03 NOTE — Anesthesia Procedure Notes (Signed)
Procedure Name: Intubation Date/Time: 06/03/2019 2:56 PM Performed by: Amadeo Garnet, CRNA Pre-anesthesia Checklist: Patient identified, Emergency Drugs available, Suction available, Patient being monitored and Timeout performed Patient Re-evaluated:Patient Re-evaluated prior to induction Oxygen Delivery Method: Circle system utilized Preoxygenation: Pre-oxygenation with 100% oxygen Induction Type: IV induction Ventilation: Mask ventilation without difficulty Laryngoscope Size: Mac and 3 Grade View: Grade I Tube type: Oral Tube size: 7.5 mm Number of attempts: 1 Airway Equipment and Method: Stylet Placement Confirmation: ETT inserted through vocal cords under direct vision,  positive ETCO2 and breath sounds checked- equal and bilateral Secured at: 22 cm Tube secured with: Tape Dental Injury: Teeth and Oropharynx as per pre-operative assessment

## 2019-06-04 ENCOUNTER — Other Ambulatory Visit: Payer: Self-pay

## 2019-06-04 DIAGNOSIS — S83281A Other tear of lateral meniscus, current injury, right knee, initial encounter: Secondary | ICD-10-CM

## 2019-06-04 LAB — CBC
HCT: 34.6 % — ABNORMAL LOW (ref 39.0–52.0)
Hemoglobin: 11.7 g/dL — ABNORMAL LOW (ref 13.0–17.0)
MCH: 30.6 pg (ref 26.0–34.0)
MCHC: 33.8 g/dL (ref 30.0–36.0)
MCV: 90.6 fL (ref 80.0–100.0)
Platelets: 166 10*3/uL (ref 150–400)
RBC: 3.82 MIL/uL — ABNORMAL LOW (ref 4.22–5.81)
RDW: 13.8 % (ref 11.5–15.5)
WBC: 14.1 10*3/uL — ABNORMAL HIGH (ref 4.0–10.5)
nRBC: 0 % (ref 0.0–0.2)

## 2019-06-04 MED ORDER — METHOCARBAMOL 750 MG PO TABS: 750.0000 mg | ORAL_TABLET | Freq: Four times a day (QID) | ORAL | 0 refills | Status: DC | PRN

## 2019-06-04 MED ORDER — KETOROLAC TROMETHAMINE 15 MG/ML IJ SOLN
15.0000 mg | Freq: Four times a day (QID) | INTRAMUSCULAR | Status: DC
  Administered 2019-06-04: 08:00:00 15 mg via INTRAVENOUS
  Filled 2019-06-04: qty 1

## 2019-06-04 MED ORDER — OXYCODONE HCL ER 10 MG PO T12A
10.0000 mg | EXTENDED_RELEASE_TABLET | Freq: Two times a day (BID) | ORAL | Status: DC
  Administered 2019-06-04: 10 mg via ORAL
  Filled 2019-06-04: qty 1

## 2019-06-04 MED ORDER — ONDANSETRON HCL 4 MG PO TABS: 4.0000 mg | ORAL_TABLET | Freq: Four times a day (QID) | ORAL | 0 refills | Status: DC | PRN

## 2019-06-04 MED ORDER — ASPIRIN EC 325 MG PO TBEC: 325.0000 mg | DELAYED_RELEASE_TABLET | Freq: Every day | ORAL | 0 refills | Status: AC

## 2019-06-04 MED ORDER — OXYCODONE HCL ER 10 MG PO T12A: 10.0000 mg | EXTENDED_RELEASE_TABLET | Freq: Two times a day (BID) | ORAL | 0 refills | Status: DC
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
## 2019-06-04 NOTE — Progress Notes (Signed)
Orthopedic Tech Progress Note Patient Details:  ORLONDO AKHTER 06/21/1966 [id number] Called in order to [facility] for a [place] Patient ID: NARINDER DOTTS, male   DOB: Oct 14, 1965, 53 y.o.   MRN: [id number]   Janit Pagan 06/04/2019, 8:42 AM

## 2019-06-04 NOTE — Progress Notes (Signed)
Pt given discharge instructions and gone over with him and his wife. All questions answered. Walker delivered to room. All belongings gathered to be sent home. Pt in no distress at discharge.
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
## 2019-06-04 NOTE — Discharge Instructions (Addendum)
Orthopaedic Trauma Service Discharge Instructions   General Discharge Instructions  WEIGHT BEARING STATUS: Non-weightbearing on right leg x 6 weeks  RANGE OF MOTION/ACTIVITY: Wear hinge knee brace at all times except when showering. Okay to work on unrestricted range of motion of knee in hinge knee brace. Okay to unclip brace while resting in order to apply ice.   Wound Care: Remove surgical dressing on post-op day #2 (Friday 06/05/19). Do not remove white steri strips that are over incisions, these will fall off on their own. Incisions can be left open to air if there is no drainage. Okay to shower and get incisions/steri-strips wet if no drainage from incisions. Apply ice to knee four times per day for 20 minutes.  DVT/PE prophylaxis: Aspirin 325 mg daily x 30 days  Diet: as you were eating previously.  Can use over the counter stool softeners and bowel preparations, such as Miralax, to help with bowel movements.  Narcotics can be constipating.  Be sure to drink plenty of fluids  PAIN MEDICATION USE AND EXPECTATIONS  You have likely been given narcotic medications to help control your pain.  After a traumatic event that results in an fracture (broken bone) with or without surgery, it is ok to use narcotic pain medications to help control one's pain.  We understand that everyone responds to pain differently and each individual patient will be evaluated on a regular basis for the continued need for narcotic medications. Ideally, narcotic medication use should last no more than 6-8 weeks (coinciding with fracture healing).   As a patient it is your responsibility as well to monitor narcotic medication use and report the amount and frequency you use these medications when you come to your office visit.   We would also advise that if you are using narcotic medications, you should take a dose prior to therapy to maximize you participation.  IF YOU ARE ON NARCOTIC MEDICATIONS IT IS NOT PERMISSIBLE  TO OPERATE A MOTOR VEHICLE (MOTORCYCLE/CAR/TRUCK/MOPED) OR HEAVY MACHINERY DO NOT MIX NARCOTICS WITH OTHER CNS (CENTRAL NERVOUS SYSTEM) DEPRESSANTS SUCH AS ALCOHOL   STOP SMOKING OR USING NICOTINE PRODUCTS!!!!  As discussed nicotine severely impairs your body's ability to heal surgical and traumatic wounds but also impairs bone healing.  Wounds and bone heal by forming microscopic blood vessels (angiogenesis) and nicotine is a vasoconstrictor (essentially, shrinks blood vessels).  Therefore, if vasoconstriction occurs to these microscopic blood vessels they essentially disappear and are unable to deliver necessary nutrients to the healing tissue.  This is one modifiable factor that you can do to dramatically increase your chances of healing your injury.    (This means no smoking, no nicotine gum, patches, etc)  DO NOT USE NONSTEROIDAL ANTI-INFLAMMATORY DRUGS (NSAID'S)  Using products such as Advil (ibuprofen), Aleve (naproxen), Motrin (ibuprofen) for additional pain control during fracture healing can delay and/or prevent the healing response.  If you would like to take over the counter (OTC) medication, Tylenol (acetaminophen) is ok.  However, some narcotic medications that are given for pain control contain acetaminophen as well. Therefore, you should not exceed more than 4000 mg of tylenol in a day if you do not have liver disease.  Also note that there are may OTC medicines, such as cold medicines and allergy medicines that my contain tylenol as well.  If you have any questions about medications and/or interactions please ask your doctor/PA or your pharmacist.      ICE AND ELEVATE INJURED/OPERATIVE EXTREMITY  Using ice and elevating  the injured extremity above your heart can help with swelling and pain control.  Icing in a pulsatile fashion, such as 20 minutes on and 20 minutes off, can be followed.    Do not place ice directly on skin. Make sure there is a barrier between to skin and the ice pack.     Using frozen items such as frozen peas works well as the conform nicely to the are that needs to be iced.  USE AN ACE WRAP OR TED HOSE FOR SWELLING CONTROL  In addition to icing and elevation, Ace wraps or TED hose are used to help limit and resolve swelling.  It is recommended to use Ace wraps or TED hose until you are informed to stop.    When using Ace Wraps start the wrapping distally (farthest away from the body) and wrap proximally (closer to the body)   Example: If you had surgery on your leg or thing and you do not have a splint on, start the ace wrap at the toes and work your way up to the thigh        If you had surgery on your upper extremity and do not have a splint on, start the ace wrap at your fingers and work your way up to the upper arm    [place]: [phone]   VISIT OUR WEBSITE FOR ADDITIONAL INFORMATION: [email or web address]      Discharge Wound Care Instructions  Do NOT apply any ointments, solutions or lotions to pin sites or surgical wounds.  These prevent needed drainage and even though solutions like hydrogen peroxide kill bacteria, they also damage cells lining the pin sites that help fight infection.  Applying lotions or ointments can keep the wounds moist and can cause them to breakdown and open up as well. This can increase the risk for infection. When in doubt call the office.  Surgical incisions should be dressed daily.  If any drainage is noted, use one layer of adaptic, then gauze, Kerlix, and an ace wrap.  Once the incision is completely dry and without drainage, it may be left open to air out.  Showering may begin 36-48 hours later.  Cleaning gently with soap and water.  Traumatic wounds should be dressed daily as well.    One layer of adaptic, gauze, Kerlix, then ace wrap.  The adaptic can be discontinued once the draining has ceased    If you have a wet to dry dressing: wet the gauze with saline the squeeze  as much saline out so the gauze is moist (not soaking wet), place moistened gauze over wound, then place a dry gauze over the moist one, followed by Kerlix wrap, then ace wrap.
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
## 2019-06-04 NOTE — TOC Transition Note (Signed)
Transition of Care [facility]) - CM/SW Discharge Note   Patient Details  Name: DENSEL MEHROTRA MRN: [id number] Date of Birth: 1966-08-28  Transition of Care [facility]) CM/SW Contact:  Midge Minium RN, BSN, NCM-BC, ACM-RN [phone] Phone Number: 06/04/2019, 11:55 AM   Clinical Narrative:    CM following for dispositional needs. CM spoke to patient to discuss the POC. Patient lived at home with spouse and was independent with ADLs PTA. PCP/Demo verified. Patient is s/p R tibial ORIF and lateral meniscus repair 9/30. PT eval completed with RW recommended. DME preference provided with [employer] selected. DME referral given to Zack ([employer] liaison); AVS updated. Patients spouse will assist post-discharge and provide transportation home. No further needs from CM.    Final next level of care: Home/Self Care Barriers to Discharge: No Barriers Identified   Patient Goals and CMS Choice   CMS Medicare.gov Compare Post Acute Care list provided to:: Patient Choice offered to / list presented to : Patient   Discharge Plan and Services                DME Arranged: Walker rolling DME Agency: [employer] Date DME Agency Contacted: 06/04/19 Time DME Agency Contacted: 46 Representative spoke with at DME Agency: [place]: NA [place] Agency: NA        Social Determinants of Health ([place]) Interventions     Readmission Risk Interventions No flowsheet data found.

## 2019-06-09 ENCOUNTER — Encounter (HOSPITAL_COMMUNITY): Payer: Self-pay | Admitting: Student

## 2019-06-14 ENCOUNTER — Encounter (HOSPITAL_COMMUNITY): Payer: Self-pay | Admitting: Student

## 2019-08-21 ENCOUNTER — Encounter: Payer: Self-pay | Admitting: *Deleted

## 2019-08-26 ENCOUNTER — Encounter: Payer: Self-pay | Admitting: *Deleted

## 2019-11-17 ENCOUNTER — Ambulatory Visit: Payer: Self-pay | Admitting: Student

## 2019-11-17 DIAGNOSIS — S82141A Displaced bicondylar fracture of right tibia, initial encounter for closed fracture: Secondary | ICD-10-CM

## 2019-11-19 ENCOUNTER — Encounter: Payer: BC Managed Care – PPO | Admitting: Sports Medicine

## 2019-11-23 ENCOUNTER — Other Ambulatory Visit (HOSPITAL_COMMUNITY)
Admission: RE | Admit: 2019-11-23 | Discharge: 2019-11-23 | Disposition: A | Discharge status: Home / Self Care | Payer: BC Managed Care – PPO | Source: Ambulatory Visit | Attending: Student | Admitting: Student

## 2019-11-23 DIAGNOSIS — Z20822 Contact with and (suspected) exposure to covid-19: Secondary | ICD-10-CM | POA: Diagnosis not present

## 2019-11-23 DIAGNOSIS — Z01812 Encounter for preprocedural laboratory examination: Secondary | ICD-10-CM | POA: Diagnosis not present

## 2019-11-23 LAB — SARS CORONAVIRUS 2 (TAT 6-24 HRS): SARS Coronavirus 2: NEGATIVE

## 2019-11-24 ENCOUNTER — Other Ambulatory Visit: Payer: Self-pay

## 2019-11-24 ENCOUNTER — Encounter (HOSPITAL_COMMUNITY): Payer: Self-pay | Admitting: Student

## 2019-11-24 NOTE — Anesthesia Preprocedure Evaluation (Addendum)
Anesthesia Evaluation  Patient identified by MRN, date of birth, ID band Patient awake    Reviewed: Allergy & Precautions, NPO status , Patient's Chart, lab work & pertinent test results  History of Anesthesia Complications Negative for: history of anesthetic complications (denies PONV)  Airway Mallampati: III  TM Distance: >3 FB Neck ROM: Full    Dental no notable dental hx. (+) Teeth Intact, Dental Advisory Given   Pulmonary sleep apnea and Continuous Positive Airway Pressure Ventilation , former smoker,    Pulmonary exam normal breath sounds clear to auscultation       Cardiovascular + Past MI  Normal cardiovascular exam Rhythm:Regular Rate:Normal  MI 1999 2/2 coronary vasospasm  Last echo 2017: nml, mild MR   Neuro/Psych PSYCHIATRIC DISORDERS Depression negative neurological ROS     GI/Hepatic Neg liver ROS, hiatal hernia, GERD  Controlled,  Endo/Other  negative endocrine ROS  Renal/GU negative Renal ROS  negative genitourinary   Musculoskeletal  (+) Arthritis , Osteoarthritis,    Abdominal Normal abdominal exam  (+)   Peds  Hematology Hx CML   Anesthesia Other Findings Right knee painful hardware s/p tibial plateau fx  Reproductive/Obstetrics negative OB ROS                          Anesthesia Physical Anesthesia Plan  ASA: III  Anesthesia Plan: General and Regional   Post-op Pain Management: GA combined w/ Regional for post-op pain   Induction: Intravenous  PONV Risk Score and Plan: 2 and Ondansetron, Dexamethasone and Midazolam  Airway Management Planned: LMA  Additional Equipment: None  Intra-op Plan:   Post-operative Plan: Extubation in OR  Informed Consent: I have reviewed the patients History and Physical, chart, labs and discussed the procedure including the risks, benefits and alternatives for the proposed anesthesia with the patient or authorized representative  who has indicated his/her understanding and acceptance.     Dental advisory given  Plan Discussed with: CRNA  Anesthesia Plan Comments: (PAT note written 11/24/2019 by Myra Gianotti, PA-C. )      Anesthesia Quick Evaluation
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
## 2019-11-24 NOTE — Progress Notes (Signed)
Pt denies SOB, chest pain, and being under the care of a cardiologist. Pt stated that PCP is Dr. Hulan Fess. Pt stated that a stress test was performed > 5 years ago. Pt denies having a chest x ray in the last year. Nurse requested EKG tracing from [facility]; awaiting response. Pt denies recent labs.  Pt made aware to stop taking vitamins, fish oil and herbal medications. Do not take any NSAIDs ie: Ibuprofen, Advil, Naproxen (Aleve), Motrin, BC and Goody Powder. Pt stated that he has been on quarantine. Pt verbalized understanding of all pre-op instructions. PA, Anesthesiology, asked to review pt history.

## 2019-11-25 ENCOUNTER — Ambulatory Visit (HOSPITAL_COMMUNITY)
Admission: RE | Admit: 2019-11-25 | Discharge: 2019-11-25 | Disposition: A | Discharge status: Home / Self Care | Payer: BC Managed Care – PPO | Attending: Student | Admitting: Student

## 2019-11-25 ENCOUNTER — Ambulatory Visit (HOSPITAL_COMMUNITY): Payer: BC Managed Care – PPO | Admitting: Certified Registered Nurse Anesthetist

## 2019-11-25 ENCOUNTER — Encounter (HOSPITAL_COMMUNITY): Payer: Self-pay | Admitting: Student

## 2019-11-25 ENCOUNTER — Ambulatory Visit (HOSPITAL_COMMUNITY): Payer: BC Managed Care – PPO

## 2019-11-25 ENCOUNTER — Encounter (HOSPITAL_COMMUNITY)
Admission: RE | Disposition: A | Discharge status: Home / Self Care | Payer: Self-pay | Source: Home / Self Care | Attending: Student

## 2019-11-25 ENCOUNTER — Other Ambulatory Visit: Payer: Self-pay

## 2019-11-25 DIAGNOSIS — T84428A Displacement of other internal orthopedic devices, implants and grafts, initial encounter: Secondary | ICD-10-CM | POA: Diagnosis not present

## 2019-11-25 DIAGNOSIS — G4733 Obstructive sleep apnea (adult) (pediatric): Secondary | ICD-10-CM | POA: Insufficient documentation

## 2019-11-25 DIAGNOSIS — C9310 Chronic myelomonocytic leukemia not having achieved remission: Secondary | ICD-10-CM | POA: Insufficient documentation

## 2019-11-25 DIAGNOSIS — S82141A Displaced bicondylar fracture of right tibia, initial encounter for closed fracture: Secondary | ICD-10-CM | POA: Insufficient documentation

## 2019-11-25 DIAGNOSIS — I252 Old myocardial infarction: Secondary | ICD-10-CM | POA: Diagnosis not present

## 2019-11-25 DIAGNOSIS — Y831 Surgical operation with implant of artificial internal device as the cause of abnormal reaction of the patient, or of later complication, without mention of misadventure at the time of the procedure: Secondary | ICD-10-CM | POA: Insufficient documentation

## 2019-11-25 DIAGNOSIS — Z87891 Personal history of nicotine dependence: Secondary | ICD-10-CM | POA: Insufficient documentation

## 2019-11-25 DIAGNOSIS — Z419 Encounter for procedure for purposes other than remedying health state, unspecified: Secondary | ICD-10-CM

## 2019-11-25 HISTORY — DX: Presence of spectacles and contact lenses: Z97.3

## 2019-11-25 HISTORY — PX: HARDWARE REMOVAL: SHX979

## 2019-11-25 HISTORY — DX: Chronic myeloid leukemia, BCR/ABL-positive, not having achieved remission: C92.10

## 2019-11-25 HISTORY — DX: Other specified postprocedural states: Z98.890

## 2019-11-25 HISTORY — DX: Nausea with vomiting, unspecified: R11.2

## 2019-11-25 HISTORY — PX: KNEE ARTHROSCOPY: SHX127

## 2019-11-25 HISTORY — DX: Personal history of other diseases of the digestive system: Z87.19

## 2019-11-25 LAB — CBC
HCT: 44.1 % (ref 39.0–52.0)
Hemoglobin: 13.8 g/dL (ref 13.0–17.0)
MCH: 29 pg (ref 26.0–34.0)
MCHC: 31.3 g/dL (ref 30.0–36.0)
MCV: 92.6 fL (ref 80.0–100.0)
Platelets: 221 10*3/uL (ref 150–400)
RBC: 4.76 MIL/uL (ref 4.22–5.81)
RDW: 13.8 % (ref 11.5–15.5)
WBC: 8.3 10*3/uL (ref 4.0–10.5)
nRBC: 0 % (ref 0.0–0.2)

## 2019-11-25 LAB — BASIC METABOLIC PANEL
Anion gap: 8 (ref 5–15)
BUN: 20 mg/dL (ref 6–20)
CO2: 26 mmol/L (ref 22–32)
Calcium: 8.8 mg/dL — ABNORMAL LOW (ref 8.9–10.3)
Chloride: 105 mmol/L (ref 98–111)
Creatinine, Ser: 1.08 mg/dL (ref 0.61–1.24)
GFR calc Af Amer: >60 mL/min (ref >60)
GFR calc non Af Amer: >60 mL/min (ref >60)
Glucose, Bld: 92 mg/dL (ref 70–99)
Potassium: 4.1 mmol/L (ref 3.5–5.1)
Sodium: 139 mmol/L (ref 135–145)

## 2019-11-25 SURGERY — REMOVAL, HARDWARE
Anesthesia: Regional | Site: Knee | Laterality: Right

## 2019-11-25 MED ORDER — LIDOCAINE 2% (20 MG/ML) 5 ML SYRINGE
INTRAMUSCULAR | Status: AC
  Filled 2019-11-25: qty 5

## 2019-11-25 MED ORDER — MORPHINE SULFATE (PF) 2 MG/ML IV SOLN
INTRAVENOUS | Status: AC
  Administered 2019-11-25: 2 mg via INTRAVENOUS
  Filled 2019-11-25: qty 1

## 2019-11-25 MED ORDER — EPHEDRINE SULFATE-NACL 50-0.9 MG/10ML-% IV SOSY
PREFILLED_SYRINGE | INTRAVENOUS | Status: DC | PRN
  Administered 2019-11-25 (×2): 2.5 mg via INTRAVENOUS
  Administered 2019-11-25: 5 mg via INTRAVENOUS

## 2019-11-25 MED ORDER — SCOPOLAMINE 1 MG/3DAYS TD PT72
1.0000 | MEDICATED_PATCH | TRANSDERMAL | Status: DC
  Filled 2019-11-25: qty 1

## 2019-11-25 MED ORDER — ACETAMINOPHEN 500 MG PO TABS
1000.0000 mg | ORAL_TABLET | Freq: Once | ORAL | Status: AC
  Administered 2019-11-25: 1000 mg via ORAL
  Filled 2019-11-25: qty 2

## 2019-11-25 MED ORDER — KETOROLAC TROMETHAMINE 30 MG/ML IJ SOLN
INTRAMUSCULAR | Status: AC
  Filled 2019-11-25: qty 1

## 2019-11-25 MED ORDER — CHLORHEXIDINE GLUCONATE 4 % EX LIQD: 60.0000 mL | Freq: Once | CUTANEOUS | Status: DC

## 2019-11-25 MED ORDER — FENTANYL CITRATE (PF) 250 MCG/5ML IJ SOLN
INTRAMUSCULAR | Status: AC
  Filled 2019-11-25: qty 5

## 2019-11-25 MED ORDER — MIDAZOLAM HCL 5 MG/5ML IJ SOLN
INTRAMUSCULAR | Status: DC | PRN
  Administered 2019-11-25: 2 mg via INTRAVENOUS

## 2019-11-25 MED ORDER — PROPOFOL 10 MG/ML IV BOLUS
INTRAVENOUS | Status: AC
  Filled 2019-11-25: qty 20

## 2019-11-25 MED ORDER — SODIUM CHLORIDE 0.9 % IR SOLN
Status: DC | PRN
  Administered 2019-11-25 (×2): 3000 mL

## 2019-11-25 MED ORDER — VANCOMYCIN HCL 1000 MG IV SOLR
INTRAVENOUS | Status: AC
  Filled 2019-11-25: qty 1000

## 2019-11-25 MED ORDER — DEXAMETHASONE SODIUM PHOSPHATE 10 MG/ML IJ SOLN
INTRAMUSCULAR | Status: DC | PRN
  Administered 2019-11-25: 4 mg via INTRAVENOUS

## 2019-11-25 MED ORDER — ONDANSETRON HCL 4 MG/2ML IJ SOLN
INTRAMUSCULAR | Status: AC
  Filled 2019-11-25: qty 2

## 2019-11-25 MED ORDER — EPINEPHRINE PF 1 MG/ML IJ SOLN
INTRAMUSCULAR | Status: DC | PRN
  Administered 2019-11-25 (×2): 1 mg

## 2019-11-25 MED ORDER — ACETAMINOPHEN-CODEINE #3 300-30 MG PO TABS
1.0000 | ORAL_TABLET | ORAL | Status: DC | PRN
  Administered 2019-11-25: 1 via ORAL
  Filled 2019-11-25: qty 1

## 2019-11-25 MED ORDER — ASPIRIN EC 81 MG PO TBEC: 81.0000 mg | DELAYED_RELEASE_TABLET | Freq: Every day | ORAL | 0 refills | Status: AC

## 2019-11-25 MED ORDER — PROMETHAZINE HCL 25 MG/ML IJ SOLN: 6.2500 mg | INTRAMUSCULAR | Status: DC | PRN

## 2019-11-25 MED ORDER — LIDOCAINE 2% (20 MG/ML) 5 ML SYRINGE
INTRAMUSCULAR | Status: DC | PRN
  Administered 2019-11-25: 80 mg via INTRAVENOUS

## 2019-11-25 MED ORDER — ROPIVACAINE HCL 5 MG/ML IJ SOLN
INTRAMUSCULAR | Status: DC | PRN
  Administered 2019-11-25: 30 mL via EPIDURAL

## 2019-11-25 MED ORDER — MORPHINE SULFATE (PF) 2 MG/ML IV SOLN
1.0000 mg | INTRAVENOUS | Status: DC | PRN
  Administered 2019-11-25: 2 mg via INTRAVENOUS

## 2019-11-25 MED ORDER — KETOROLAC TROMETHAMINE 30 MG/ML IJ SOLN
30.0000 mg | Freq: Once | INTRAMUSCULAR | Status: AC | PRN
  Administered 2019-11-25: 30 mg via INTRAVENOUS

## 2019-11-25 MED ORDER — EPINEPHRINE PF 1 MG/ML IJ SOLN
INTRAMUSCULAR | Status: AC
  Filled 2019-11-25: qty 2

## 2019-11-25 MED ORDER — PROPOFOL 10 MG/ML IV BOLUS
INTRAVENOUS | Status: DC | PRN
  Administered 2019-11-25: 170 mg via INTRAVENOUS

## 2019-11-25 MED ORDER — ROCURONIUM BROMIDE 10 MG/ML (PF) SYRINGE
PREFILLED_SYRINGE | INTRAVENOUS | Status: AC
  Filled 2019-11-25: qty 10

## 2019-11-25 MED ORDER — OXYCODONE HCL ER 10 MG PO T12A: 10.0000 mg | EXTENDED_RELEASE_TABLET | Freq: Two times a day (BID) | ORAL | 0 refills | Status: AC

## 2019-11-25 MED ORDER — MORPHINE SULFATE (PF) 2 MG/ML IV SOLN
INTRAVENOUS | Status: AC
  Filled 2019-11-25: qty 1

## 2019-11-25 MED ORDER — CEFAZOLIN SODIUM-DEXTROSE 2-4 GM/100ML-% IV SOLN
2.0000 g | INTRAVENOUS | Status: AC
  Administered 2019-11-25: 2 g via INTRAVENOUS
  Filled 2019-11-25: qty 100

## 2019-11-25 MED ORDER — 0.9 % SODIUM CHLORIDE (POUR BTL) OPTIME
TOPICAL | Status: DC | PRN
  Administered 2019-11-25: 1000 mL

## 2019-11-25 MED ORDER — DEXAMETHASONE SODIUM PHOSPHATE 10 MG/ML IJ SOLN
INTRAMUSCULAR | Status: AC
  Filled 2019-11-25: qty 1

## 2019-11-25 MED ORDER — MIDAZOLAM HCL 2 MG/2ML IJ SOLN
INTRAMUSCULAR | Status: AC
  Filled 2019-11-25: qty 2

## 2019-11-25 MED ORDER — ONDANSETRON HCL 4 MG/2ML IJ SOLN
INTRAMUSCULAR | Status: DC | PRN
  Administered 2019-11-25: 4 mg via INTRAVENOUS

## 2019-11-25 MED ORDER — FENTANYL CITRATE (PF) 100 MCG/2ML IJ SOLN
INTRAMUSCULAR | Status: DC | PRN
  Administered 2019-11-25 (×2): 25 ug via INTRAVENOUS
  Administered 2019-11-25: 50 ug via INTRAVENOUS

## 2019-11-25 MED ORDER — ACETAMINOPHEN-CODEINE #3 300-30 MG PO TABS: 1.0000 | ORAL_TABLET | Freq: Four times a day (QID) | ORAL | 0 refills | Status: DC | PRN

## 2019-11-25 MED ORDER — DEXAMETHASONE SODIUM PHOSPHATE 10 MG/ML IJ SOLN
INTRAMUSCULAR | Status: DC | PRN
  Administered 2019-11-25: 10 mg via INTRAVENOUS

## 2019-11-25 MED ORDER — LACTATED RINGERS IV SOLN: INTRAVENOUS | Status: DC | PRN

## 2019-11-25 SURGICAL SUPPLY — 68 items
BANDAGE ESMARK 6X9 LF (GAUZE/BANDAGES/DRESSINGS) ×2 IMPLANT
BNDG COHESIVE 6X5 TAN STRL LF (GAUZE/BANDAGES/DRESSINGS) ×4 IMPLANT
BNDG ELASTIC 4X5.8 VLCR STR LF (GAUZE/BANDAGES/DRESSINGS) ×2 IMPLANT
BNDG ELASTIC 6X10 VLCR STRL LF (GAUZE/BANDAGES/DRESSINGS) ×2 IMPLANT
BNDG ELASTIC 6X5.8 VLCR STR LF (GAUZE/BANDAGES/DRESSINGS) ×2 IMPLANT
BNDG ESMARK 6X9 LF (GAUZE/BANDAGES/DRESSINGS)
BNDG GAUZE ELAST 4 BULKY (GAUZE/BANDAGES/DRESSINGS) ×4 IMPLANT
BRUSH SCRUB EZ PLAIN DRY (MISCELLANEOUS) ×6 IMPLANT
CHLORAPREP W/TINT 26 (MISCELLANEOUS) ×4 IMPLANT
CLOSURE WOUND 1/2 X4 (GAUZE/BANDAGES/DRESSINGS) ×1
COVER SURGICAL LIGHT HANDLE (MISCELLANEOUS) ×6 IMPLANT
COVER WAND RF STERILE (DRAPES) ×2 IMPLANT
CUFF TOURN SGL QUICK 18X4 (TOURNIQUET CUFF) IMPLANT
CUFF TOURN SGL QUICK 24 (TOURNIQUET CUFF)
CUFF TOURN SGL QUICK 34 (TOURNIQUET CUFF)
CUFF TRNQT CYL 24X4X16.5-23 (TOURNIQUET CUFF) IMPLANT
CUFF TRNQT CYL 34X4.125X (TOURNIQUET CUFF) IMPLANT
DISSECTOR 4.0MMX13CM CVD (MISCELLANEOUS) ×2 IMPLANT
DRAPE ARTHROSCOPY W/POUCH 114 (DRAPES) ×2 IMPLANT
DRAPE C-ARM 42X72 X-RAY (DRAPES) ×2 IMPLANT
DRAPE C-ARMOR (DRAPES) ×4 IMPLANT
DRAPE U-SHAPE 47X51 STRL (DRAPES) ×4 IMPLANT
DRSG ADAPTIC 3X8 NADH LF (GAUZE/BANDAGES/DRESSINGS) ×2 IMPLANT
ELECT REM PT RETURN 9FT ADLT (ELECTROSURGICAL) ×4
ELECTRODE REM PT RTRN 9FT ADLT (ELECTROSURGICAL) ×2 IMPLANT
GAUZE SPONGE 4X4 12PLY STRL (GAUZE/BANDAGES/DRESSINGS) ×4 IMPLANT
GLOVE BIO SURGEON STRL SZ 6.5 (GLOVE) ×9 IMPLANT
GLOVE BIO SURGEON STRL SZ7.5 (GLOVE) ×10 IMPLANT
GLOVE BIO SURGEONS STRL SZ 6.5 (GLOVE) ×3
GLOVE BIOGEL PI IND STRL 6.5 (GLOVE) ×2 IMPLANT
GLOVE BIOGEL PI IND STRL 7.5 (GLOVE) ×2 IMPLANT
GLOVE BIOGEL PI INDICATOR 6.5 (GLOVE) ×2
GLOVE BIOGEL PI INDICATOR 7.5 (GLOVE) ×2
GOWN STRL REUS W/ TWL LRG LVL3 (GOWN DISPOSABLE) ×4 IMPLANT
GOWN STRL REUS W/TWL LRG LVL3 (GOWN DISPOSABLE) ×16
KIT BASIN OR (CUSTOM PROCEDURE TRAY) ×4 IMPLANT
KIT TURNOVER KIT B (KITS) ×4 IMPLANT
MANIFOLD NEPTUNE II (INSTRUMENTS) ×4 IMPLANT
NEEDLE 22X1 1/2 (OR ONLY) (NEEDLE) ×2 IMPLANT
NS IRRIG 1000ML POUR BTL (IV SOLUTION) ×4 IMPLANT
PACK ORTHO EXTREMITY (CUSTOM PROCEDURE TRAY) ×4 IMPLANT
PAD ARMBOARD 7.5X6 YLW CONV (MISCELLANEOUS) ×8 IMPLANT
PAD CAST 4YDX4 CTTN HI CHSV (CAST SUPPLIES) IMPLANT
PADDING CAST COTTON 4X4 STRL (CAST SUPPLIES) ×4
PADDING CAST COTTON 6X4 STRL (CAST SUPPLIES) ×8 IMPLANT
SPONGE LAP 18X18 RF (DISPOSABLE) ×2 IMPLANT
STAPLER VISISTAT 35W (STAPLE) IMPLANT
STOCKINETTE IMPERVIOUS LG (DRAPES) ×4 IMPLANT
STRIP CLOSURE SKIN 1/2X4 (GAUZE/BANDAGES/DRESSINGS) ×1 IMPLANT
SUCTION FRAZIER HANDLE 10FR (MISCELLANEOUS) ×4
SUCTION TUBE FRAZIER 10FR DISP (MISCELLANEOUS) IMPLANT
SUT ETHILON 3 0 PS 1 (SUTURE) IMPLANT
SUT MNCRL AB 3-0 PS2 18 (SUTURE) ×6 IMPLANT
SUT MON AB 2-0 CT1 36 (SUTURE) ×2 IMPLANT
SUT PDS AB 2-0 CT1 27 (SUTURE) IMPLANT
SUT VIC AB 0 CT1 27 (SUTURE) ×4
SUT VIC AB 0 CT1 27XBRD ANBCTR (SUTURE) IMPLANT
SUT VIC AB 2-0 CT1 27 (SUTURE) ×4
SUT VIC AB 2-0 CT1 TAPERPNT 27 (SUTURE) IMPLANT
SYR CONTROL 10ML LL (SYRINGE) IMPLANT
TOWEL GREEN STERILE (TOWEL DISPOSABLE) ×6 IMPLANT
TOWEL GREEN STERILE FF (TOWEL DISPOSABLE) ×6 IMPLANT
TUBE CONNECTING 12'X1/4 (SUCTIONS) ×1
TUBE CONNECTING 12X1/4 (SUCTIONS) ×3 IMPLANT
TUBING ARTHROSCOPY IRRIG 16FT (MISCELLANEOUS) ×2 IMPLANT
UNDERPAD 30X30 (UNDERPADS AND DIAPERS) ×4 IMPLANT
WATER STERILE IRR 1000ML POUR (IV SOLUTION) ×4 IMPLANT
YANKAUER SUCT BULB TIP NO VENT (SUCTIONS) ×4 IMPLANT

## 2019-11-25 NOTE — Interval H&P Note (Signed)
History and Physical Interval Note:  11/25/2019 6:49 AM  [name] [name]  has presented today for surgery, with the diagnosis of Right painful hardware.  The various methods of treatment have been discussed with the patient and family. After consideration of risks, benefits and other options for treatment, the patient has consented to  Procedure(s): HARDWARE REMOVAL RIGHT KNEE WITH KNEE ARTHROSCOPY (Right) as a surgical intervention.  The patient's history has been reviewed, patient examined, no change in status, stable for surgery.  I have reviewed the patient's chart and labs.  Questions were answered to the patient's satisfaction.     Hiram Gash

## 2019-11-25 NOTE — Anesthesia Procedure Notes (Signed)
Procedure Name: LMA Insertion Performed by: Milford Cage, CRNA Pre-anesthesia Checklist: Patient identified, Emergency Drugs available, Suction available and Patient being monitored Patient Re-evaluated:Patient Re-evaluated prior to induction Oxygen Delivery Method: Circle System Utilized Preoxygenation: Pre-oxygenation with 100% oxygen Induction Type: IV induction Ventilation: Mask ventilation without difficulty LMA: LMA with gastric port inserted LMA Size: 5.0 Number of attempts: 1 Airway Equipment and Method: Bite block Placement Confirmation: positive ETCO2 Tube secured with: Tape Dental Injury: Teeth and Oropharynx as per pre-operative assessment

## 2019-11-25 NOTE — H&P (Signed)
Orthopaedic Trauma Service (OTS) H&P  Patient ID: [name] [name] MRN: [id number] DOB/AGE: [birth year] 56 y.o.  Reason for surgery: Right leg hardware prominence  HPI: [name] [name] is an 54 y.o. male who presents for hardware removal from right leg. Patient was in [facility] in Sept 2020. Underwent ORIF of right tibial plateau fracture. Patient has done well but has developed prominence of his hardware in last 2 weeks. Due to prominence of hardware, I recommended removal of hardware. While under anesthesia we recommended right knee arthroscopy to evaluate the joint as well due to some valgus alignment of the leg.  Past Medical History:  Diagnosis Date  . Arthritis   . CML (chronic myelocytic leukemia) ([place])   . CML (chronic myeloid leukemia) ([place])   . Coronary artery vasospasm ([facility])    History of MI secondary to coronary artery vasospasm (1999)  . Depression   . GERD (gastroesophageal reflux disease)   . History of hiatal hernia   . Hyperlipidemia   . No pertinent past medical history   . OSA (obstructive sleep apnea)    mild so patient did not want to be on CPAP  . PONV (postoperative nausea and vomiting)   . Sinus disorder   . Wears contact lenses     Past Surgical History:  Procedure Laterality Date  . CARDIAC CATHETERIZATION  99,04   both normal, chest pain felt secondary to coronary vasospasm  . COLONOSCOPY    . HAND / FINGER LESION EXCISION  2009   fx lt metacarpal  . NASAL SEPTOPLASTY W/ TURBINOPLASTY  08/13/2011   Procedure: NASAL SEPTOPLASTY WITH TURBINATE REDUCTION;  Surgeon: Tyson Alias;  Location: [facility];  Service: ENT;  Laterality: N/A;  with LAUP  . ORIF TIBIA PLATEAU Right 06/03/2019   Procedure: OPEN REDUCTION INTERNAL FIXATION (ORIF) TIBIAL PLATEAU;  Surgeon: Shona Needles, MD;  Location: [facility];  Service: Orthopedics;  Laterality: Right;  . TONSILLECTOMY      History reviewed. No pertinent family history.  Social History:  reports that  he has quit smoking. He has never used smokeless tobacco. He reports current alcohol use. He reports that he does not use drugs.  Allergies:  Allergies  Allergen Reactions  . Hydrocodone Nausea Only    SEVERE NAUSEA (patient noted he CAN tolerate Morphine, Codeine, Ibuprofen, Tylenol, and Naproxen without anti-nausea meds)  . Oxycodone Nausea Only    SEVERE NAUSEA (patient noted he CAN tolerate Morphine, Codeine, Ibuprofen, Tylenol, and Naproxen without anti-nausea meds)    Medications:  No current facility-administered medications on file prior to encounter.   Current Outpatient Medications on File Prior to Encounter  Medication Sig Dispense Refill  . ALPRAZolam (XANAX) 0.5 MG tablet Take 0.25 mg by mouth 2 (two) times daily as needed for anxiety.     . diphenhydrAMINE (BENADRYL) 25 MG tablet Take 25 mg by mouth at bedtime as needed for sleep.     . methocarbamol (ROBAXIN) 750 MG tablet Take 1 tablet (750 mg total) by mouth every 6 (six) hours as needed for muscle spasms. 28 tablet 0  . nilotinib (TASIGNA) 150 MG capsule TAKE 2 CAPSULES (300 MG TOTAL) EVERY 12 HOURS (Patient taking differently: Take 300 mg by mouth every 12 (twelve) hours. ) 112 capsule 6  . ondansetron (ZOFRAN) 4 MG tablet Take 1 tablet (4 mg total) by mouth every 6 (six) hours as needed for nausea. 28 tablet 0  . sildenafil (VIAGRA) 25 MG tablet Take 1 tablet (25  mg total) by mouth daily as needed for erectile dysfunction. 10 tablet 0  . oxyCODONE (OXYCONTIN) 10 mg 12 hr tablet Take 1 tablet (10 mg total) by mouth every 12 (twelve) hours. (Patient not taking: Reported on 11/23/2019) 14 tablet 0    ROS: Constitutional: No fever or chills Vision: No changes in vision ENT: No difficulty swallowing CV: No chest pain Pulm: No SOB or wheezing GI: No nausea or vomiting GU: No urgency or inability to hold urine Skin: No poor wound healing Neurologic: No numbness or tingling Psychiatric: No depression or anxiety Heme: No  bruising Allergic: No reaction to medications or food   Exam: Blood pressure 122/64, pulse (!) 51, temperature 97.9 F (36.6 C), temperature source Oral, resp. rate 20, height 6\' 2"  (1.88 m), weight 89.8 kg, SpO2 100 %. General:NAD Orientation:AAOx3 Mood and Affect: Cooperative and pleasant  Gait: within normal limitis Coordination and balance: WNL  Injured Extremity (CV, lymph, sensation, reflexes): RLE: Mild valgus alignment to right leg. Incision healed. Prominence of lateral screws, no breakthrough the skin. Neurovascularly intact    Medical Decision Making: Data: Imaging: Healed plateau fracture, some articular settling of joint  Labs:  Results for orders placed or performed during the hospital encounter of 11/23/19 (from the past 48 hour(s))  SARS CORONAVIRUS 2 (TAT 6-24 HRS) Nasopharyngeal Nasopharyngeal Swab     Status: None   Collection Time: 11/23/19 12:04 PM   Specimen: Nasopharyngeal Swab  Result Value Ref Range   SARS Coronavirus 2 NEGATIVE NEGATIVE    Comment: (NOTE) SARS-CoV-2 target nucleic acids are NOT DETECTED. The SARS-CoV-2 RNA is generally detectable in upper and lower respiratory specimens during the acute phase of infection. Negative results do not preclude SARS-CoV-2 infection, do not rule out co-infections with other pathogens, and should not be used as the sole basis for treatment or other patient management decisions. Negative results must be combined with clinical observations, patient history, and epidemiological information. The expected result is Negative. Fact Sheet for Patients: SugarRoll.be Fact Sheet for Healthcare Providers: https://www.woods-mathews.com/ This test is not yet approved or cleared by the [place] FDA and  has been authorized for detection and/or diagnosis of SARS-CoV-2 by FDA under an Emergency Use Authorization (EUA). This EUA will remain  in effect (meaning this test can be  used) for the duration of the COVID-19 declaration under Section 56 4(b)(1) of the Act, 21 U.S.C. section 360bbb-3(b)(1), unless the authorization is terminated or revoked sooner. Performed at [facility], [place] [address], [place], [place] [address]     Medical history and chart was reviewed and case discussed with medical provider.  Assessment/Plan: 54 yo s/p ORIF of right tibial plateau fracture presents for hardware removal  Risks and benefits discussed and he agrees to proceed with surgery. Dr. Griffin Basil will assist with knee arthroscopy portion of the procedure. Plan to discharge postoperatively  Shona Needles, MD [facility] [phone] (office) [email or web address]

## 2019-11-25 NOTE — Anesthesia Postprocedure Evaluation (Signed)
Anesthesia Post Note  Patient: [name] [name]  Procedure(s) Performed: HARDWARE REMOVAL (Right ) Arthroscopy Knee (Right Knee)     Patient location during evaluation: PACU Anesthesia Type: General Level of consciousness: awake and alert and oriented Pain management: pain level controlled Vital Signs Assessment: post-procedure vital signs reviewed and stable Respiratory status: spontaneous breathing, nonlabored ventilation and respiratory function stable Cardiovascular status: blood pressure returned to baseline Postop Assessment: no apparent nausea or vomiting Anesthetic complications: no    Last Vitals:  Vitals:   11/25/19 1040 11/25/19 1055  BP: 132/75   Pulse: 68 69  Resp: 11 (!) 24  Temp: 36.9 C   SpO2: 99% 99%    Last Pain:  Vitals:   11/25/19 1040  TempSrc:   PainSc: [place]

## 2019-11-25 NOTE — Discharge Instructions (Addendum)
Orthopaedic Trauma Service Discharge Instructions   General Discharge Instructions  WEIGHT BEARING STATUS: Weightbearing as tolerated right lower extremity  RANGE OF MOTION/ACTIVITY: Okay for gentle range of motion of right knee.   Wound Care: Okay to remove surgical dressing on post op day #2 (Friday 11/27/19). Leave steri-strips in place.  Incisions can be left open to air if there is no drainage. If incision continues to have drainage, follow wound care instructions below. Okay to shower and get steri strips wet starting on Saturday 11/28/19   DVT/PE prophylaxis: Aspirin 81 mg daily x 3 weeks  Diet: as you were eating previously.  Can use over the counter stool softeners and bowel preparations, such as Miralax, to help with bowel movements.  Narcotics can be constipating.  Be sure to drink plenty of fluids  PAIN MEDICATION USE AND EXPECTATIONS  You have likely been given narcotic medications to help control your pain.  After a traumatic event that results in an fracture (broken bone) with or without surgery, it is ok to use narcotic pain medications to help control one's pain.  We understand that everyone responds to pain differently and each individual patient will be evaluated on a regular basis for the continued need for narcotic medications. Ideally, narcotic medication use should last no more than 6-8 weeks (coinciding with fracture healing).   As a patient it is your responsibility as well to monitor narcotic medication use and report the amount and frequency you use these medications when you come to your office visit.   We would also advise that if you are using narcotic medications, you should take a dose prior to therapy to maximize you participation.  IF YOU ARE ON NARCOTIC MEDICATIONS IT IS NOT PERMISSIBLE TO OPERATE A MOTOR VEHICLE (MOTORCYCLE/CAR/TRUCK/MOPED) OR HEAVY MACHINERY DO NOT MIX NARCOTICS WITH OTHER CNS (CENTRAL NERVOUS SYSTEM) DEPRESSANTS SUCH AS ALCOHOL   STOP  SMOKING OR USING NICOTINE PRODUCTS!!!!  As discussed nicotine severely impairs your body's ability to heal surgical and traumatic wounds but also impairs bone healing.  Wounds and bone heal by forming microscopic blood vessels (angiogenesis) and nicotine is a vasoconstrictor (essentially, shrinks blood vessels).  Therefore, if vasoconstriction occurs to these microscopic blood vessels they essentially disappear and are unable to deliver necessary nutrients to the healing tissue.  This is one modifiable factor that you can do to dramatically increase your chances of healing your injury.    (This means no smoking, no nicotine gum, patches, etc)  DO NOT USE NONSTEROIDAL ANTI-INFLAMMATORY DRUGS (NSAID'S)  Using products such as Advil (ibuprofen), Aleve (naproxen), Motrin (ibuprofen) for additional pain control during fracture healing can delay and/or prevent the healing response.  If you would like to take over the counter (OTC) medication, Tylenol (acetaminophen) is ok.  However, some narcotic medications that are given for pain control contain acetaminophen as well. Therefore, you should not exceed more than 4000 mg of tylenol in a day if you do not have liver disease.  Also note that there are may OTC medicines, such as cold medicines and allergy medicines that my contain tylenol as well.  If you have any questions about medications and/or interactions please ask your doctor/PA or your pharmacist.      ICE AND ELEVATE INJURED/OPERATIVE EXTREMITY  Using ice and elevating the injured extremity above your heart can help with swelling and pain control.  Icing in a pulsatile fashion, such as 20 minutes on and 20 minutes off, can be followed.    Do  not place ice directly on skin. Make sure there is a barrier between to skin and the ice pack.    Using frozen items such as frozen peas works well as the conform nicely to the are that needs to be iced.  USE AN ACE WRAP OR TED HOSE FOR SWELLING CONTROL  In  addition to icing and elevation, Ace wraps or TED hose are used to help limit and resolve swelling.  It is recommended to use Ace wraps or TED hose until you are informed to stop.    When using Ace Wraps start the wrapping distally (farthest away from the body) and wrap proximally (closer to the body)   Example: If you had surgery on your leg or thing and you do not have a splint on, start the ace wrap at the toes and work your way up to the thigh        If you had surgery on your upper extremity and do not have a splint on, start the ace wrap at your fingers and work your way up to the upper arm     [place]: [phone]   VISIT OUR WEBSITE FOR ADDITIONAL INFORMATION: [email or web address]   Discharge Wound Care Instructions  Do NOT apply any ointments, solutions or lotions to pin sites or surgical wounds.  These prevent needed drainage and even though solutions like hydrogen peroxide kill bacteria, they also damage cells lining the pin sites that help fight infection.  Applying lotions or ointments can keep the wounds moist and can cause them to breakdown and open up as well. This can increase the risk for infection. When in doubt call the office.  Surgical incisions should be dressed daily.  If any drainage is noted, use one layer of adaptic, then gauze, Kerlix, and an ace wrap.  Once the incision is completely dry and without drainage, it may be left open to air out.  Showering may begin 36-48 hours later.  Cleaning gently with soap and water.  Traumatic wounds should be dressed daily as well.    One layer of adaptic, gauze, Kerlix, then ace wrap.  The adaptic can be discontinued once the draining has ceased    If you have a wet to dry dressing: wet the gauze with saline the squeeze as much saline out so the gauze is moist (not soaking wet), place moistened gauze over wound, then place a dry gauze over the moist one, followed by Kerlix wrap, then ace  wrap.

## 2019-11-25 NOTE — Transfer of Care (Signed)
Immediate Anesthesia Transfer of Care Note  Patient: [name] [name]  Procedure(s) Performed: HARDWARE REMOVAL (Right ) Arthroscopy Knee (Right Knee)  Patient Location: PACU  Anesthesia Type:GA combined with regional for post-op pain  Level of Consciousness: awake  Airway & Oxygen Therapy: Patient Spontanous Breathing and Patient connected to nasal cannula oxygen  Post-op Assessment: Report given to RN and Post -op Vital signs reviewed and stable  Post vital signs: Reviewed and stable  Last Vitals:  Vitals Value Taken Time  BP 103/83 11/25/19 0914  Temp    Pulse 79 11/25/19 0914  Resp 16 11/25/19 0914  SpO2 100 % 11/25/19 0914  Vitals shown include unvalidated device data.  Last Pain:  Vitals:   11/25/19 0650  TempSrc:   PainSc: 0-No pain         Complications: No apparent anesthesia complications

## 2019-11-25 NOTE — Op Note (Signed)
Orthopaedic Surgery Operative Note (CSN: [id number])  [name] [name]  Nov 03, 1965 Date of Surgery: 11/25/2019   Diagnoses:  Right knee painful hardware and mechanical symptoms  Procedure: Right knee arthroscopy with partial lateral meniscectomy and chondroplasty   Operative Finding Exam under anesthesia: Patient lacked about 5 degrees short of full extension and full flexion, Lachman had a slightly soft endpoint compared to the contralateral side but did have a stop. Suprapatellar pouch: Normal Patellofemoral Compartment: Moderate grade 2 changes in the trochlea centrally, patella looked to have some mild grade 1 changes Medial Compartment: Relatively normal meniscus, grade 1 and 2 changes on the femoral condyle Lateral Compartment: Patient had the sequelae of his previous fracture that was noted with multiple areas that were consistent with his previous fracture.  He did have one visible screw anteriorly that was just underneath the lateral meniscus, there was some grade 2 and 3 changes on the lateral condyle and grade 3 changes on the tibial plateau only at the sites of the fracture lines however the remainder of it looked relatively intact. Intercondylar Notch: ACL took some tension during an anterior drawer test however was not completely normal, PCL look normal.  Successful completion of the planned procedure.  We able to perform a lateral meniscectomy and chondroplasty and assessment of the joint to accompany Dr. Doreatha Martin of surgery.  From my standpoint he can weight-bear to tolerance but this will be determined by Dr. Doreatha Martin.  He will follow-up with Dr. Doreatha Martin and DVT prophylaxis per Dr. Doreatha Martin   Post-Op Diagnosis: Same Surgeons:Primary: Shona Needles, MD, co-surgeon Ophelia Charter Assistants:Caroline McBane PA-C Location: [facility] OR ROOM 07 Anesthesia: General with local anesthetic Antibiotics: Ancef 2 g with local vancomycin powder 1 g at the surgical site Tourniquet time: * No tourniquets  in log * Estimated Blood Loss: Minimal Complications: None Specimens: None Implants: Implant Name Type Inv. Item Serial No. Manufacturer Lot No. LRB No. Used Action  PLATE TIBIA 3.5 VA-LCP 8H RT S - [id number] Plate PLATE TIBIA 3.5 VA-LCP 8H RT S  [employer]  Right 1 Explanted  SCREW CORT 3.5X42M SELF TAP - [id number] Screw SCREW CORT 3.5X42M SELF TAP  [employer]  Right 1 Explanted  SCREW CORTEX 3.5 38MM - [id number] Screw SCREW CORTEX 3.5 38MM  [employer]  Right 1 Explanted  SCREW CORTEX 3.5 40MM - [id number] Screw SCREW CORTEX 3.5 40MM  [employer]  Right 1 Explanted  SCREW HEADED ST 3.5X85 - [id number] Screw SCREW HEADED ST 3.5X85  [employer]  Right 1 Explanted  SCREW LOCK 3.5X85 - [id number] Screw SCREW LOCK 3.5X85  [employer]  Right 1 Explanted  SCREW LOCKING 3.5X80MM VA - [id number] Screw SCREW LOCKING 3.5X80MM VA  [employer]  Right 1 Explanted  SCREW LOCKING VA 3.5X85MM - [id number] Screw SCREW LOCKING VA 3.5X85MM  [employer]  Right 2 Explanted    Indications for Surgery:   [name] is a 54 y.o. male with previous complex tibial plateau fracture with fixation by Dr. Doreatha Martin.  He had painful hardware and then some concern of intra-articular mechanical symptoms and there was discussion of diagnostic arthroscopy with possible meniscectomy and chondroplasty in addition to his lateral hardware removal.  Dr. Doreatha Martin asked for my assistance due to the arthroscopy portion of the case.  Benefits and risks of operative and nonoperative management were discussed prior to surgery with patient/guardian(s) and informed consent form was completed.  Specific risks including infection, need for additional surgery, stiffness, need for further surgery,  chondral disease amongst others   Procedure:   The patient was identified properly. Informed consent was obtained and the surgical site was marked. The patient was taken up to suite where general anesthesia was induced. The patient  was placed in the supine position with a post against the surgical leg and a nonsterile tourniquet applied. The surgical leg was then prepped and draped usual sterile fashion.  A standard surgical timeout was performed.  2 standard anterior portals were made and diagnostic arthroscopy performed. Please note the findings as noted above.  We cleared the joint with findings above.  Gentle chondroplasty was performed of the medial femoral condyle as well as the lateral femoral condyle and lateral tibial plateau.  Partial lateral meniscectomy was performed about 10% of total meniscal volume was removed this being mostly peripheral tissue that was likely damage at the time of his injury.  Case was turned over to Dr. Doreatha Martin at the end of the arthroscopy.

## 2019-11-25 NOTE — Anesthesia Procedure Notes (Signed)
Anesthesia Regional Block: Adductor canal block   Pre-Anesthetic Checklist: ,, timeout performed, Correct Patient, Correct Site, Correct Laterality, Correct Procedure, Correct Position, site marked, Risks and benefits discussed,  Surgical consent,  Pre-op evaluation,  At surgeon's request and post-op pain management  Laterality: Right  Prep: Maximum Sterile Barrier Precautions used, chloraprep       Needles:  Injection technique: Single-shot  Needle Type: Echogenic Stimulator Needle     Needle Length: 9cm  Needle Gauge: 22     Additional Needles:   Procedures:,,,, ultrasound used (permanent image in chart),,,,  Narrative:  Start time: 11/25/2019 6:55 AM End time: 11/25/2019 7:00 AM Injection made incrementally with aspirations every 5 mL.  Performed by: Personally  Anesthesiologist: Pervis Hocking, DO  Additional Notes: Monitors applied. No increased pain on injection. No increased resistance to injection. Injection made in 5cc increments. Good needle visualization. Patient tolerated procedure well.
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
## 2019-11-26 ENCOUNTER — Encounter: Payer: Self-pay | Admitting: *Deleted

## 2020-01-12 ENCOUNTER — Encounter: Payer: BC Managed Care – PPO | Admitting: Sports Medicine

## 2020-07-14 ENCOUNTER — Other Ambulatory Visit: Payer: Self-pay

## 2020-07-14 ENCOUNTER — Ambulatory Visit (INDEPENDENT_AMBULATORY_CARE_PROVIDER_SITE_OTHER): Payer: BC Managed Care – PPO | Admitting: Sports Medicine

## 2020-07-14 DIAGNOSIS — M2170 Unequal limb length (acquired), unspecified site: Secondary | ICD-10-CM | POA: Diagnosis not present

## 2020-07-14 DIAGNOSIS — Q667 Congenital pes cavus, unspecified foot: Secondary | ICD-10-CM | POA: Diagnosis not present

## 2020-07-15 NOTE — Assessment & Plan Note (Signed)
This is no longer significant after his TKR  We did not add lift to new orthotics and he looked balanced on gait

## 2020-07-15 NOTE — Assessment & Plan Note (Signed)
Orthotics made today These feel comfortable and control his foot strike He needs to remain in these if he is to get back to running once knee is improved  I advised him to not start Take time and get better control of knee motion and swelling  Might try a compression sleeve

## 2020-07-15 NOTE — Progress Notes (Signed)
CC: Custom Orthotics  Patient had orthotics made by me in 2011 They have held up well Ran multiple marathons and trained since then  Tibial plateau fracture in 11/2019 This ultimately required a RT knee replacement DR Doneta Public did this in ~ April  Would like to get back to running Feels orthotics are no longer as supportive as needed Orthotics made for Cavus foot and LLI originallty  Past Hx CML and in major molecular response Remains on Tx nilotnib bid  ROS  RT knee feels tight in flexion Still some swelling after walking standing for a long day  PE Pleasant M in NAD BP 124/72   Ht 6\' 2"  (1.88 m)   Wt 195 lb (88.5 kg)   BMI 25.04 kg/m  [facility] Adult Exercise 07/14/2020  Frequency of aerobic exercise (# of days/week) 5  Average time in minutes 40  Frequency of strengthening activities (# of days/week) 5   Alignment is good Large anterior knee scar RT Extension is - 3 deg RT Flexion is 120 deg RT Mild warmth and slight swelling  Feet with Cavus shape Leg length - after TKR this looks essentially equal  Patient was fitted for a : standard, cushioned, semi-rigid orthotic. The orthotic was heated and afterward the patient stood on the orthotic blank positioned on the orthotic stand. The patient was positioned in subtalar neutral position and 10 degrees of ankle dorsiflexion in a weight bearing stance. After completion of molding, a stable base was applied to the orthotic blank. The blank was ground to a stable position for weight bearing. Size: 12 Red. Medium EVA Base: blue med. Density EVA Posting:none Additional orthotic padding: none  Post orthotic gait - neutral foot strike However, still has limited flexion and limp on RT knee with slow jog

## 2020-09-15 ENCOUNTER — Other Ambulatory Visit: Payer: Self-pay

## 2020-09-15 ENCOUNTER — Ambulatory Visit: Payer: BC Managed Care – PPO | Admitting: Cardiology

## 2020-09-15 ENCOUNTER — Encounter: Payer: Self-pay | Admitting: Cardiology

## 2020-09-15 VITALS — BP 131/72 | HR 61 | Temp 98.2°F | Ht 74.0 in | Wt 205.0 lb

## 2020-09-15 DIAGNOSIS — E785 Hyperlipidemia, unspecified: Secondary | ICD-10-CM

## 2020-09-15 DIAGNOSIS — I77811 Abdominal aortic ectasia: Secondary | ICD-10-CM | POA: Insufficient documentation

## 2020-09-15 DIAGNOSIS — Z856 Personal history of leukemia: Secondary | ICD-10-CM

## 2020-09-15 DIAGNOSIS — Z5181 Encounter for therapeutic drug level monitoring: Secondary | ICD-10-CM

## 2020-09-15 DIAGNOSIS — I340 Nonrheumatic mitral (valve) insufficiency: Secondary | ICD-10-CM

## 2020-09-15 NOTE — Progress Notes (Signed)
Primary Physician/Referring:  Hulan Fess, MD  Patient ID: [name] [name], male    DOB: 02/29/1966, 55 y.o.   MRN: [id number]  Chief Complaint  Patient presents with  . Follow-up  . Aortic ectasia and mitral regurgitation.   HPI:    [name] [name]  is a 55 y.o. Caucasian male with CML presently in remission, who is fairly active, I seen him 5 years ago for atypical chest pain, remotely had been diagnosed as coronary spasm, he has had a normal echocardiogram and abdominal aortic duplex had revealed aortic ectasia.  Is presently on immunotherapy for CML, as it has cardiovascular effects, also for evaluation of abdominal aortic ectasia, he now presents to reestablish care.  Past medical history significant for mild hyperlipidemia.  Negative for diabetes or hypertension.  Past Medical History:  Diagnosis Date  . Arthritis   . CML (chronic myelocytic leukemia) ([place])   . CML (chronic myeloid leukemia) ([place])   . Coronary artery vasospasm ([facility])    History of MI secondary to coronary artery vasospasm (1999)  . Depression   . GERD (gastroesophageal reflux disease)   . History of hiatal hernia   . Hyperlipidemia   . No pertinent past medical history   . OSA (obstructive sleep apnea)    mild so patient did not want to be on CPAP  . PONV (postoperative nausea and vomiting)   . Sinus disorder   . Wears contact lenses    Past Surgical History:  Procedure Laterality Date  . CARDIAC CATHETERIZATION  99,04   both normal, chest pain felt secondary to coronary vasospasm  . COLONOSCOPY    . HAND / FINGER LESION EXCISION  2009   fx lt metacarpal  . HARDWARE REMOVAL Right 11/25/2019   Procedure: HARDWARE REMOVAL;  Surgeon: Shona Needles, MD;  Location: [facility];  Service: Orthopedics;  Laterality: Right;  . KNEE ARTHROSCOPY Right 11/25/2019   Procedure: Arthroscopy Knee;  Surgeon: Hiram Gash, MD;  Location: [facility];  Service: Orthopedics;  Laterality: Right;  . NASAL SEPTOPLASTY W/  TURBINOPLASTY  08/13/2011   Procedure: NASAL SEPTOPLASTY WITH TURBINATE REDUCTION;  Surgeon: Tyson Alias;  Location: [facility];  Service: ENT;  Laterality: N/A;  with LAUP  . ORIF TIBIA PLATEAU Right 06/03/2019   Procedure: OPEN REDUCTION INTERNAL FIXATION (ORIF) TIBIAL PLATEAU;  Surgeon: Shona Needles, MD;  Location: [facility];  Service: Orthopedics;  Laterality: Right;  . TONSILLECTOMY     Family History  Problem Relation Age of Onset  . Breast cancer Mother     Social History   Tobacco Use  . Smoking status: Former Smoker    Types: Cigarettes    Quit date: 02/01/1998    Years since quitting: 22.6  . Smokeless tobacco: Never Used  . Tobacco comment: quit smoking cigarettes in 1999  Substance Use Topics  . Alcohol use: Yes    Alcohol/week: 1.0 standard drink    Types: 1 Glasses of wine per week    Comment: occasional   Marital Status: Married  ROS  Review of Systems  Cardiovascular: Negative for chest pain, dyspnea on exertion and leg swelling.  Gastrointestinal: Negative for melena.   Objective  Blood pressure 131/72, pulse 61, temperature 98.2 F (36.8 C), height '6\' 2"'  (1.88 m), weight 205 lb (93 kg), SpO2 99 %.  Vitals with BMI 09/15/2020 07/14/2020 11/25/2019  Height '6\' 2"'  '6\' 2"'  -  Weight 205 lbs 195 lbs -  BMI 26.31 25.03 -  Systolic 580 998 -  Diastolic 72 72 -  Pulse 61 - 69     Physical Exam Constitutional:      Appearance: Normal appearance. He is normal weight.  Cardiovascular:     Rate and Rhythm: Normal rate and regular rhythm.     Pulses: Intact distal pulses.     Heart sounds: Normal heart sounds. No murmur heard. No gallop.      Comments: No leg edema, no JVD. Pulmonary:     Effort: Pulmonary effort is normal.     Breath sounds: Normal breath sounds.  Abdominal:     General: Bowel sounds are normal.     Palpations: Abdomen is soft.    Laboratory examination:   Recent Labs    11/25/19 0655  NA 139  K 4.1  CL 105  CO2 26   GLUCOSE 92  BUN 20  CREATININE 1.08  CALCIUM 8.8*  GFRNONAA >60  GFRAA >60   CrCl cannot be calculated (Patient's most recent lab result is older than the maximum 21 days allowed.).  CMP Latest Ref Rng & Units 11/25/2019 06/02/2019 06/30/2018  Glucose 70 - 99 mg/dL 92 174(H) 95  BUN 6 - 20 mg/dL 20 20 26(H)  Creatinine 0.61 - 1.24 mg/dL 1.08 1.13 1.12  Sodium 135 - 145 mmol/L 139 140 140  Potassium 3.5 - 5.1 mmol/L 4.1 4.2 4.5  [place] 98 - 111 mmol/L 105 104 105  CO2 22 - 32 mmol/L '26 25 28  ' Calcium 8.9 - 10.3 mg/dL 8.8(L) 9.3 9.2  Total Protein 6.5 - 8.1 g/dL - 6.5 6.8  Total Bilirubin 0.3 - 1.2 mg/dL - 1.3(H) 1.1  Alkaline Phos 38 - 126 U/L - 56 62  AST 15 - 41 U/L - 36 23  ALT 0 - 44 U/L - 31 24   CBC Latest Ref Rng & Units 11/25/2019 06/04/2019 06/02/2019  WBC 4.0 - 10.5 K/uL 8.3 14.1(H) 14.6(H)  Hemoglobin 13.0 - 17.0 g/dL 13.8 11.7(L) 13.4  Hematocrit 39.0 - 52.0 % 44.1 34.6(L) 39.8  Platelets 150 - 400 K/uL 221 166 214    Lipid Panel No results for input(s): CHOL, TRIG, LDLCALC, VLDL, HDL, CHOLHDL, LDLDIRECT in the last 8760 hours.  HEMOGLOBIN A1C No results found for: HGBA1C, MPG TSH No results for input(s): TSH in the last 8760 hours.  External labs:   04/15/2020:  PSA normal at 0.86.  TSH normal.  Hb 14.0/HCT 42.9, platelets 170.  WBC 6.1.  Serum glucose 99 mg, BUN 25, creatinine 1.20, EGFR 63 mL, potassium 4.5, sodium 141.  CMP otherwise normal.  Total cholesterol 193, triglycerides 67, HDL 60. LDL 120,  Non-HDL cholesterol 133.  Medications and allergies   Allergies  Allergen Reactions  . Hydrocodone Nausea Only    SEVERE NAUSEA (patient noted he CAN tolerate Morphine, Codeine, Ibuprofen, Tylenol, and Naproxen without anti-nausea meds)  . Oxycodone Nausea Only    SEVERE NAUSEA (patient noted he CAN tolerate Morphine, Codeine, Ibuprofen, Tylenol, and Naproxen without anti-nausea meds)     Outpatient Medications Prior to Visit  Medication Sig  Dispense Refill  . ALPRAZolam (XANAX) 0.5 MG tablet Take 0.25 mg by mouth 2 (two) times daily as needed for anxiety.     . diphenhydrAMINE (BENADRYL) 25 MG tablet Take 25 mg by mouth at bedtime as needed for sleep.     . nilotinib (TASIGNA) 150 MG capsule TAKE 2 CAPSULES (300 MG TOTAL) EVERY 12 HOURS (Patient taking differently: Take 300 mg by mouth every 12 (  twelve) hours.) 112 capsule 6  . sildenafil (VIAGRA) 25 MG tablet Take 1 tablet (25 mg total) by mouth daily as needed for erectile dysfunction. 10 tablet 0   No facility-administered medications prior to visit.    Radiology:   No results found.  Cardiac Studies:   Echocardiogram 02/02/2016:  1. Left ventricle cavity is normal in size. Normal global wall motion. Normal diastolic filling pattern. Calculated EF 66%. 2. Left atrial cavity is borderline dilated. 3. Mild (Grade I) mitral regurgitation. 4. Trace tricuspid regurgitation. 5. Trace pulmonic regurgitation. 6. IVC is dilated with blunted respiratory response. Although this may represent elevated right heart pressure, there was no other e/o RV strain. 7. The imaged liver appeared to be slightly echo luscent and consider dedicated imaging if clinically indicated.   US abdomen 02/09/2016: 1. Normal appearance of the liver. 2. Left kidney cyst 3. Ectatic abdominal aorta at risk for aneurysm development. Recommend followup by ultrasound in 5 years.   EKG:     EKG 09/15/2020: Normal sinus rhythm/sinus bradycardia at rate of 55 bpm, normal axis.  No evidence of ischemia, normal EKG.      Assessment     ICD-10-CM   1. Abdominal aortic ectasia (HCC)  I77.811 EKG 12-Lead    PCV AORTA DUPLEX    CT CARDIAC SCORING (SELF PAY ONLY)  2. H/O: CML (chronic myeloid leukemia)  Z85.6 PCV ECHOCARDIOGRAM COMPLETE  3. Therapeutic drug monitoring  Z51.81 PCV ECHOCARDIOGRAM COMPLETE  4. Mild hyperlipidemia  E78.5 CT CARDIAC SCORING (SELF PAY ONLY)  5. Mild mitral regurgitation   I34.0 PCV ECHOCARDIOGRAM COMPLETE     There are no discontinued medications.  No orders of the defined types were placed in this encounter.  Orders Placed This Encounter  Procedures  . CT CARDIAC SCORING (SELF PAY ONLY)    Standing Status:   Future    Standing Expiration Date:   11/13/2020    Order Specific Question:   Preferred imaging location?    Answer:   [facility]    Order Specific Question:   Radiology Contrast Protocol - do NOT remove file path    Answer:   [email or web address]  . EKG 12-Lead  . PCV ECHOCARDIOGRAM COMPLETE    Standing Status:   Future    Standing Expiration Date:   09/15/2021    Recommendations:   [name] [name] is a 55 y.o. Caucasian male with CML presently in remission, who is fairly active presently on immunotherapy for CML, as it has cardiovascular effects, also for evaluation of abdominal aortic ectasia, he now presents to reestablish care.  Past medical history significant for mild hyperlipidemia.  Negative for diabetes or hypertension.  His physical examination and EKG are normal.  I will obtain an echocardiogram for therapeutic drug monitoring, he has had mild mitral regurgitation previously also.  I will also repeat abdominal aortic duplex to follow-up on abdominal aortic ectasia.  He has mild hyperlipidemia.  I would like to restart if I am by coronary calcium score.  I will see him back on annual basis.  I will keep him up-to-date with the test results.  If he did indeed have significant coronary calcification, will have a low threshold to start him on a statin therapy.    Adrian Prows, MD, [facility] 09/15/2020, 6:03 PM Office: [phone]

## 2020-09-22 ENCOUNTER — Other Ambulatory Visit: Payer: Self-pay

## 2020-09-22 ENCOUNTER — Ambulatory Visit (HOSPITAL_COMMUNITY)
Admission: RE | Admit: 2020-09-22 | Discharge: 2020-09-22 | Disposition: A | Discharge status: Home / Self Care | Payer: BC Managed Care – PPO | Source: Ambulatory Visit | Attending: Cardiology | Admitting: Cardiology

## 2020-09-22 DIAGNOSIS — E785 Hyperlipidemia, unspecified: Secondary | ICD-10-CM | POA: Insufficient documentation

## 2020-09-22 DIAGNOSIS — I77811 Abdominal aortic ectasia: Secondary | ICD-10-CM | POA: Insufficient documentation

## 2020-10-13 ENCOUNTER — Ambulatory Visit: Payer: BC Managed Care – PPO

## 2020-10-13 ENCOUNTER — Other Ambulatory Visit: Payer: Self-pay

## 2020-10-13 DIAGNOSIS — I340 Nonrheumatic mitral (valve) insufficiency: Secondary | ICD-10-CM

## 2020-10-13 DIAGNOSIS — I77811 Abdominal aortic ectasia: Secondary | ICD-10-CM

## 2020-10-13 DIAGNOSIS — Z5181 Encounter for therapeutic drug level monitoring: Secondary | ICD-10-CM

## 2020-10-13 DIAGNOSIS — Z856 Personal history of leukemia: Secondary | ICD-10-CM

## 2020-10-17 DIAGNOSIS — I340 Nonrheumatic mitral (valve) insufficiency: Secondary | ICD-10-CM

## 2020-10-17 DIAGNOSIS — I341 Nonrheumatic mitral (valve) prolapse: Secondary | ICD-10-CM

## 2020-10-18 NOTE — Telephone Encounter (Signed)
ICD-10-CM   1. Moderate mitral regurgitation  I34.0 PCV ECHOCARDIOGRAM COMPLETE  2. MVP (mitral valve prolapse)  I34.1 PCV ECHOCARDIOGRAM COMPLETE   Orders Placed This Encounter  Procedures  . PCV ECHOCARDIOGRAM COMPLETE    Standing Status:   Future    Standing Expiration Date:   10/18/2021    Scheduling Instructions:     OV after echo     Adrian Prows, MD, [facility] 10/18/2020, 9:37 AM Office: [phone] Pager: [phone]

## 2021-03-21 ENCOUNTER — Other Ambulatory Visit: Payer: Self-pay

## 2021-03-21 ENCOUNTER — Ambulatory Visit: Payer: BC Managed Care – PPO

## 2021-03-21 DIAGNOSIS — I341 Nonrheumatic mitral (valve) prolapse: Secondary | ICD-10-CM

## 2021-03-21 DIAGNOSIS — I340 Nonrheumatic mitral (valve) insufficiency: Secondary | ICD-10-CM

## 2021-03-26 NOTE — Progress Notes (Signed)
Discussed with patient. And I will set him up to see me in  6 months. He is asymptomatic.

## 2021-09-07 DIAGNOSIS — E785 Hyperlipidemia, unspecified: Secondary | ICD-10-CM | POA: Diagnosis not present

## 2021-09-07 DIAGNOSIS — Z9221 Personal history of antineoplastic chemotherapy: Secondary | ICD-10-CM | POA: Diagnosis not present

## 2021-09-07 DIAGNOSIS — Z8616 Personal history of COVID-19: Secondary | ICD-10-CM | POA: Diagnosis not present

## 2021-09-07 DIAGNOSIS — Z79899 Other long term (current) drug therapy: Secondary | ICD-10-CM | POA: Diagnosis not present

## 2021-09-07 DIAGNOSIS — Z6825 Body mass index (BMI) 25.0-25.9, adult: Secondary | ICD-10-CM | POA: Diagnosis not present

## 2021-09-07 DIAGNOSIS — C9210 Chronic myeloid leukemia, BCR/ABL-positive, not having achieved remission: Secondary | ICD-10-CM | POA: Diagnosis not present

## 2021-09-11 DIAGNOSIS — M9901 Segmental and somatic dysfunction of cervical region: Secondary | ICD-10-CM | POA: Diagnosis not present

## 2021-09-11 DIAGNOSIS — M9902 Segmental and somatic dysfunction of thoracic region: Secondary | ICD-10-CM | POA: Diagnosis not present

## 2021-09-11 DIAGNOSIS — M531 Cervicobrachial syndrome: Secondary | ICD-10-CM | POA: Diagnosis not present

## 2021-09-11 DIAGNOSIS — M5032 Other cervical disc degeneration, mid-cervical region, unspecified level: Secondary | ICD-10-CM | POA: Diagnosis not present

## 2021-09-14 ENCOUNTER — Ambulatory Visit: Payer: BC Managed Care – PPO | Admitting: Cardiology

## 2021-09-17 DIAGNOSIS — G4733 Obstructive sleep apnea (adult) (pediatric): Secondary | ICD-10-CM | POA: Diagnosis not present

## 2021-09-19 DIAGNOSIS — M25511 Pain in right shoulder: Secondary | ICD-10-CM | POA: Diagnosis not present

## 2021-09-21 ENCOUNTER — Other Ambulatory Visit: Payer: Self-pay | Admitting: Orthopaedic Surgery

## 2021-09-21 ENCOUNTER — Other Ambulatory Visit: Payer: Self-pay | Admitting: Neurology

## 2021-09-21 ENCOUNTER — Other Ambulatory Visit (HOSPITAL_COMMUNITY): Payer: Self-pay | Admitting: Orthopaedic Surgery

## 2021-09-21 DIAGNOSIS — M25511 Pain in right shoulder: Secondary | ICD-10-CM

## 2021-09-22 ENCOUNTER — Ambulatory Visit: Admission: RE | Admit: 2021-09-22 | Payer: BC Managed Care – PPO | Source: Ambulatory Visit

## 2021-10-04 DIAGNOSIS — D225 Melanocytic nevi of trunk: Secondary | ICD-10-CM | POA: Diagnosis not present

## 2021-10-04 DIAGNOSIS — M25511 Pain in right shoulder: Secondary | ICD-10-CM | POA: Diagnosis not present

## 2021-10-04 DIAGNOSIS — L570 Actinic keratosis: Secondary | ICD-10-CM | POA: Diagnosis not present

## 2021-10-04 DIAGNOSIS — L821 Other seborrheic keratosis: Secondary | ICD-10-CM | POA: Diagnosis not present

## 2021-10-04 DIAGNOSIS — L814 Other melanin hyperpigmentation: Secondary | ICD-10-CM | POA: Diagnosis not present

## 2021-10-05 ENCOUNTER — Ambulatory Visit: Payer: BC Managed Care – PPO | Admitting: Cardiology

## 2021-10-06 DIAGNOSIS — M25511 Pain in right shoulder: Secondary | ICD-10-CM | POA: Diagnosis not present

## 2021-10-10 DIAGNOSIS — C9210 Chronic myeloid leukemia, BCR/ABL-positive, not having achieved remission: Secondary | ICD-10-CM | POA: Diagnosis not present

## 2021-10-10 DIAGNOSIS — S43431A Superior glenoid labrum lesion of right shoulder, initial encounter: Secondary | ICD-10-CM | POA: Diagnosis not present

## 2021-10-11 DIAGNOSIS — M542 Cervicalgia: Secondary | ICD-10-CM | POA: Diagnosis not present

## 2021-10-13 DIAGNOSIS — H43812 Vitreous degeneration, left eye: Secondary | ICD-10-CM | POA: Diagnosis not present

## 2021-10-16 DIAGNOSIS — M542 Cervicalgia: Secondary | ICD-10-CM | POA: Diagnosis not present

## 2021-10-18 DIAGNOSIS — G4733 Obstructive sleep apnea (adult) (pediatric): Secondary | ICD-10-CM | POA: Diagnosis not present

## 2021-10-23 ENCOUNTER — Encounter: Payer: Self-pay | Admitting: Cardiology

## 2021-10-23 ENCOUNTER — Other Ambulatory Visit: Payer: Self-pay

## 2021-10-23 ENCOUNTER — Ambulatory Visit: Payer: BC Managed Care – PPO | Admitting: Cardiology

## 2021-10-23 VITALS — BP 126/72 | HR 72 | Temp 98.3°F | Resp 16 | Ht 74.0 in | Wt 193.8 lb

## 2021-10-23 DIAGNOSIS — E785 Hyperlipidemia, unspecified: Secondary | ICD-10-CM

## 2021-10-23 DIAGNOSIS — I340 Nonrheumatic mitral (valve) insufficiency: Secondary | ICD-10-CM

## 2021-10-23 DIAGNOSIS — I341 Nonrheumatic mitral (valve) prolapse: Secondary | ICD-10-CM | POA: Diagnosis not present

## 2021-10-23 DIAGNOSIS — M542 Cervicalgia: Secondary | ICD-10-CM | POA: Diagnosis not present

## 2021-10-23 NOTE — Progress Notes (Signed)
Primary Physician/Referring:  Kathyrn Lass, MD  Patient ID: [name] [name], male    DOB: [birth year], 56 y.o.   MRN: [id number]  Chief Complaint  Patient presents with   Abdominal aortic ectasia    Follow-up    6 months   HPI:    [name] [name]  is a 56 y.o. Caucasian male with CML presently in remission, who is fairly active presently on immunotherapy for CML, as it has cardiovascular effects, specifically prolongation of QT interval, also for evaluation of a mitral valve prolapse and mild hyperlipidemia.  Negative for diabetes or hypertension.  Negative for diabetes or hypertension.  [name] [name] remains asymptomatic.  Past Medical History:  Diagnosis Date   Arthritis    CML (chronic myelocytic leukemia) ([place])    CML (chronic myeloid leukemia) ([place])    Coronary artery vasospasm ([facility])    History of MI secondary to coronary artery vasospasm (1999)   Depression    GERD (gastroesophageal reflux disease)    History of hiatal hernia    Hyperlipidemia    No pertinent past medical history    OSA (obstructive sleep apnea)    mild so patient did not want to be on CPAP   PONV (postoperative nausea and vomiting)    Sinus disorder    Wears contact lenses    Past Surgical History:  Procedure Laterality Date   CARDIAC CATHETERIZATION  99,04   both normal, chest pain felt secondary to coronary vasospasm   COLONOSCOPY     HAND / FINGER LESION EXCISION  2009   fx lt metacarpal   HARDWARE REMOVAL Right 11/25/2019   Procedure: HARDWARE REMOVAL;  Surgeon: Shona Needles, MD;  Location: [place];  Service: Orthopedics;  Laterality: Right;   KNEE ARTHROSCOPY Right 11/25/2019   Procedure: Arthroscopy Knee;  Surgeon: Hiram Gash, MD;  Location: [place];  Service: Orthopedics;  Laterality: Right;   NASAL SEPTOPLASTY W/ TURBINOPLASTY  08/13/2011   Procedure: NASAL SEPTOPLASTY WITH TURBINATE REDUCTION;  Surgeon: Tyson Alias;  Location: [place];  Service: ENT;  Laterality: N/A;  with LAUP    ORIF TIBIA PLATEAU Right 06/03/2019   Procedure: OPEN REDUCTION INTERNAL FIXATION (ORIF) TIBIAL PLATEAU;  Surgeon: Shona Needles, MD;  Location: [place];  Service: Orthopedics;  Laterality: Right;   TONSILLECTOMY     Family History  Problem Relation Age of Onset   Breast cancer Mother    Stroke Father 44       Brain bleed stroke   Other Father        Pacemaker    Social History   Tobacco Use   Smoking status: Former    Types: Cigarettes    Quit date: 02/01/1998    Years since quitting: 23.7   Smokeless tobacco: Never   Tobacco comments:    quit smoking cigarettes in 1999  Substance Use Topics   Alcohol use: Yes    Alcohol/week: 1.0 standard drink    Types: 1 Glasses of wine per week    Comment: occasional   Marital Status: Married  ROS  Review of Systems  Cardiovascular:  Negative for chest pain, dyspnea on exertion and leg swelling.  Gastrointestinal:  Negative for melena.  Objective  Blood pressure 126/72, pulse 72, temperature 98.3 F (36.8 C), temperature source Temporal, resp. rate 16, height _0  (1.88 m), weight 193 lb 12.8 oz (87.9 kg), SpO2 100 %.  Vitals with BMI 10/23/2021 10/23/2021 09/15/2020  Height - _1  _2   Weight - 193 lbs 13 oz 205 lbs  BMI - 62.37 62.83  Systolic 151 761 607  Diastolic 72 70 72  Pulse - 72 61     Physical Exam Neck:     Vascular: No JVD.  Cardiovascular:     Rate and Rhythm: Normal rate and regular rhythm.     Pulses: Intact distal pulses.     Heart sounds: A midsystolic click. No murmur heard.   No gallop.  Pulmonary:     Effort: Pulmonary effort is normal.     Breath sounds: Normal breath sounds.  Abdominal:     General: Bowel sounds are normal.     Palpations: Abdomen is soft.  Musculoskeletal:     Right lower leg: No edema.     Left lower leg: No edema.   Laboratory examination:   External labs:   Labs 03/29/2021:  Sodium 137, potassium 4.2, BUN 15, creatinine 1.14, EGFR 75 mill, LFTs normal.  Uric acid  level normal at 6.6.  Hb 13.9/HCT 41.7, platelets 167, normal indicis.  Total cholesterol 183, triglycerides 51, HDL 66, LDL 107.  TSH normal at 0.894.  04/15/2020:  Total cholesterol 193, triglycerides 67, HDL 60. LDL 120,  Non-HDL cholesterol 133.  Medications and allergies   Allergies  Allergen Reactions   Hydrocodone Nausea Only    SEVERE NAUSEA (patient noted [name] [name] CAN tolerate Morphine, Codeine, Ibuprofen, Tylenol, and Naproxen without anti-nausea meds)   Oxycodone Nausea Only    SEVERE NAUSEA (patient noted [name] [name] CAN tolerate Morphine, Codeine, Ibuprofen, Tylenol, and Naproxen without anti-nausea meds)     Outpatient Medications Prior to Visit  Medication Sig Dispense Refill   ALPRAZolam (XANAX) 0.5 MG tablet Take 0.25 mg by mouth 2 (two) times daily as needed for anxiety.      diphenhydrAMINE (BENADRYL) 25 MG tablet Take 25 mg by mouth at bedtime as needed for sleep.      nilotinib (TASIGNA) 150 MG capsule TAKE 2 CAPSULES (300 MG TOTAL) EVERY 12 HOURS (Patient taking differently: Take 300 mg by mouth every 12 (twelve) hours.) 112 capsule 6   sildenafil (VIAGRA) 25 MG tablet Take 1 tablet (25 mg total) by mouth daily as needed for erectile dysfunction. 10 tablet 0   No facility-administered medications prior to visit.    Radiology:   No results found.  Cardiac Studies:   Coronary calcium score 09/22/2020: Coronary calcium score of 1, 40th percentile for age and sex matched control.  Noted in the RCA, otherwise normal.  Ascending aorta measuring 36 mm.  PCV ECHOCARDIOGRAM COMPLETE 10/13/2020  Narrative Echocardiogram 10/13/2020: Left ventricle cavity is normal in size. Mild concentric hypertrophy of the left ventricle. Normal global wall motion. Normal LV systolic function with EF 55%. Normal diastolic filling pattern. Left atrial cavity is moderately dilated. Mild bileaflet prolapse with moderate (grade II) mitral regurgitation. Mild to moderate tricuspid regurgitation.  Estimated pulmonary artery systolic pressure 33 mmHg. Compared to previous study in 2017, valvular regurgitation has increased form mild MR and trace TR. Mild PH is new.   EKG:   EKGs 10/23/2021: Normal sinus rhythm at rate of 59 bpm, normal axis, early repolarization.  Normal QT interval.  Normal EKG.   Assessment     ICD-10-CM   1. Moderate mitral regurgitation  I34.0 EKG 12-Lead    2. MVP (mitral valve prolapse)  I34.1     3. Mild hyperlipidemia  E78.5        There are no discontinued medications.  No orders of  the defined types were placed in this encounter.  Orders Placed This Encounter  Procedures   EKG 12-Lead   Recommendations:   [name] [name] is a 56 y.o. Caucasian male with CML presently in remission, who is fairly active presently on immunotherapy for CML, as it has cardiovascular effects, specifically prolongation of QT interval, also for evaluation of a mitral valve prolapse and mild hyperlipidemia.  Negative for diabetes or hypertension.  His physical examination and EKG are normal, QT interval is normal which is needed for his monitoring of immunotherapy.  On auscultation, I do not hear any murmur today but midsystolic click is well heard.  [name] [name] remains asymptomatic and continues to exercise regularly without any symptoms.  Hence we will continue to monitor this by physical exam and symptoms.  Could consider repeating echocardiogram once every 3 years unless symptoms develop.  [name] [name] has mild hyperlipidemia.  LDL has improved from last year and patient does not want to be on a statin.  I reviewed his coronary calcium score which is just 1 at 40th percentile.  Initially blood pressure was mildly elevated, but on sitting and rechecking, blood pressure is normal.  I will see him back in 1 year or sooner if problems.     Adrian Prows, MD, [facility] 10/23/2021, 10:07 AM Office: [phone]

## 2021-10-26 DIAGNOSIS — S52502A Unspecified fracture of the lower end of left radius, initial encounter for closed fracture: Secondary | ICD-10-CM | POA: Diagnosis not present

## 2021-10-26 DIAGNOSIS — M542 Cervicalgia: Secondary | ICD-10-CM | POA: Diagnosis not present

## 2021-10-30 DIAGNOSIS — M542 Cervicalgia: Secondary | ICD-10-CM | POA: Diagnosis not present

## 2021-11-08 DIAGNOSIS — M542 Cervicalgia: Secondary | ICD-10-CM | POA: Diagnosis not present

## 2021-11-15 DIAGNOSIS — G4733 Obstructive sleep apnea (adult) (pediatric): Secondary | ICD-10-CM | POA: Diagnosis not present

## 2021-11-15 DIAGNOSIS — H43812 Vitreous degeneration, left eye: Secondary | ICD-10-CM | POA: Diagnosis not present

## 2021-11-15 DIAGNOSIS — M542 Cervicalgia: Secondary | ICD-10-CM | POA: Diagnosis not present

## 2021-11-21 DIAGNOSIS — M5032 Other cervical disc degeneration, mid-cervical region, unspecified level: Secondary | ICD-10-CM | POA: Diagnosis not present

## 2021-11-21 DIAGNOSIS — M531 Cervicobrachial syndrome: Secondary | ICD-10-CM | POA: Diagnosis not present

## 2021-11-21 DIAGNOSIS — M9901 Segmental and somatic dysfunction of cervical region: Secondary | ICD-10-CM | POA: Diagnosis not present

## 2021-11-21 DIAGNOSIS — M9902 Segmental and somatic dysfunction of thoracic region: Secondary | ICD-10-CM | POA: Diagnosis not present

## 2021-11-24 DIAGNOSIS — M542 Cervicalgia: Secondary | ICD-10-CM | POA: Diagnosis not present

## 2021-11-28 DIAGNOSIS — M542 Cervicalgia: Secondary | ICD-10-CM | POA: Diagnosis not present

## 2021-11-30 DIAGNOSIS — M9901 Segmental and somatic dysfunction of cervical region: Secondary | ICD-10-CM | POA: Diagnosis not present

## 2021-11-30 DIAGNOSIS — M531 Cervicobrachial syndrome: Secondary | ICD-10-CM | POA: Diagnosis not present

## 2021-11-30 DIAGNOSIS — M9902 Segmental and somatic dysfunction of thoracic region: Secondary | ICD-10-CM | POA: Diagnosis not present

## 2021-11-30 DIAGNOSIS — M5032 Other cervical disc degeneration, mid-cervical region, unspecified level: Secondary | ICD-10-CM | POA: Diagnosis not present

## 2021-12-01 DIAGNOSIS — M542 Cervicalgia: Secondary | ICD-10-CM | POA: Diagnosis not present

## 2021-12-06 DIAGNOSIS — C9210 Chronic myeloid leukemia, BCR/ABL-positive, not having achieved remission: Secondary | ICD-10-CM | POA: Diagnosis not present

## 2021-12-14 DIAGNOSIS — Z885 Allergy status to narcotic agent status: Secondary | ICD-10-CM | POA: Diagnosis not present

## 2021-12-14 DIAGNOSIS — Z9221 Personal history of antineoplastic chemotherapy: Secondary | ICD-10-CM | POA: Diagnosis not present

## 2021-12-14 DIAGNOSIS — I252 Old myocardial infarction: Secondary | ICD-10-CM | POA: Diagnosis not present

## 2021-12-14 DIAGNOSIS — C9210 Chronic myeloid leukemia, BCR/ABL-positive, not having achieved remission: Secondary | ICD-10-CM | POA: Diagnosis not present

## 2021-12-14 DIAGNOSIS — Z8616 Personal history of COVID-19: Secondary | ICD-10-CM | POA: Diagnosis not present

## 2021-12-14 DIAGNOSIS — Z6825 Body mass index (BMI) 25.0-25.9, adult: Secondary | ICD-10-CM | POA: Diagnosis not present

## 2021-12-16 DIAGNOSIS — G4733 Obstructive sleep apnea (adult) (pediatric): Secondary | ICD-10-CM | POA: Diagnosis not present

## 2021-12-19 DIAGNOSIS — M9902 Segmental and somatic dysfunction of thoracic region: Secondary | ICD-10-CM | POA: Diagnosis not present

## 2021-12-19 DIAGNOSIS — M5032 Other cervical disc degeneration, mid-cervical region, unspecified level: Secondary | ICD-10-CM | POA: Diagnosis not present

## 2021-12-19 DIAGNOSIS — M9901 Segmental and somatic dysfunction of cervical region: Secondary | ICD-10-CM | POA: Diagnosis not present

## 2021-12-19 DIAGNOSIS — M531 Cervicobrachial syndrome: Secondary | ICD-10-CM | POA: Diagnosis not present

## 2022-01-12 DIAGNOSIS — C9210 Chronic myeloid leukemia, BCR/ABL-positive, not having achieved remission: Secondary | ICD-10-CM | POA: Diagnosis not present

## 2022-01-15 DIAGNOSIS — G4733 Obstructive sleep apnea (adult) (pediatric): Secondary | ICD-10-CM | POA: Diagnosis not present

## 2022-01-16 DIAGNOSIS — M25521 Pain in right elbow: Secondary | ICD-10-CM | POA: Diagnosis not present

## 2022-01-18 DIAGNOSIS — Z6825 Body mass index (BMI) 25.0-25.9, adult: Secondary | ICD-10-CM | POA: Diagnosis not present

## 2022-01-18 DIAGNOSIS — I252 Old myocardial infarction: Secondary | ICD-10-CM | POA: Diagnosis not present

## 2022-01-18 DIAGNOSIS — Z885 Allergy status to narcotic agent status: Secondary | ICD-10-CM | POA: Diagnosis not present

## 2022-01-18 DIAGNOSIS — C9210 Chronic myeloid leukemia, BCR/ABL-positive, not having achieved remission: Secondary | ICD-10-CM | POA: Diagnosis not present

## 2022-01-18 DIAGNOSIS — M7711 Lateral epicondylitis, right elbow: Secondary | ICD-10-CM | POA: Diagnosis not present

## 2022-01-18 DIAGNOSIS — Z87891 Personal history of nicotine dependence: Secondary | ICD-10-CM | POA: Diagnosis not present

## 2022-01-18 DIAGNOSIS — Z79899 Other long term (current) drug therapy: Secondary | ICD-10-CM | POA: Diagnosis not present

## 2022-01-25 DIAGNOSIS — M7711 Lateral epicondylitis, right elbow: Secondary | ICD-10-CM | POA: Diagnosis not present

## 2022-01-25 DIAGNOSIS — M25511 Pain in right shoulder: Secondary | ICD-10-CM | POA: Diagnosis not present

## 2022-02-01 DIAGNOSIS — M7711 Lateral epicondylitis, right elbow: Secondary | ICD-10-CM | POA: Diagnosis not present

## 2022-02-09 DIAGNOSIS — C9210 Chronic myeloid leukemia, BCR/ABL-positive, not having achieved remission: Secondary | ICD-10-CM | POA: Diagnosis not present

## 2022-02-15 DIAGNOSIS — G4733 Obstructive sleep apnea (adult) (pediatric): Secondary | ICD-10-CM | POA: Diagnosis not present

## 2022-02-26 DIAGNOSIS — G4733 Obstructive sleep apnea (adult) (pediatric): Secondary | ICD-10-CM | POA: Diagnosis not present

## 2022-03-13 DIAGNOSIS — M5032 Other cervical disc degeneration, mid-cervical region, unspecified level: Secondary | ICD-10-CM | POA: Diagnosis not present

## 2022-03-13 DIAGNOSIS — M531 Cervicobrachial syndrome: Secondary | ICD-10-CM | POA: Diagnosis not present

## 2022-03-13 DIAGNOSIS — M9902 Segmental and somatic dysfunction of thoracic region: Secondary | ICD-10-CM | POA: Diagnosis not present

## 2022-03-13 DIAGNOSIS — M9901 Segmental and somatic dysfunction of cervical region: Secondary | ICD-10-CM | POA: Diagnosis not present

## 2022-03-15 DIAGNOSIS — C9210 Chronic myeloid leukemia, BCR/ABL-positive, not having achieved remission: Secondary | ICD-10-CM | POA: Diagnosis not present

## 2022-03-16 DIAGNOSIS — G4733 Obstructive sleep apnea (adult) (pediatric): Secondary | ICD-10-CM | POA: Diagnosis not present

## 2022-03-17 DIAGNOSIS — G4733 Obstructive sleep apnea (adult) (pediatric): Secondary | ICD-10-CM | POA: Diagnosis not present

## 2022-04-16 DIAGNOSIS — G4733 Obstructive sleep apnea (adult) (pediatric): Secondary | ICD-10-CM | POA: Diagnosis not present

## 2022-04-17 DIAGNOSIS — G4733 Obstructive sleep apnea (adult) (pediatric): Secondary | ICD-10-CM | POA: Diagnosis not present

## 2022-05-10 DIAGNOSIS — C9210 Chronic myeloid leukemia, BCR/ABL-positive, not having achieved remission: Secondary | ICD-10-CM | POA: Diagnosis not present

## 2022-05-14 DIAGNOSIS — M531 Cervicobrachial syndrome: Secondary | ICD-10-CM | POA: Diagnosis not present

## 2022-05-14 DIAGNOSIS — M9901 Segmental and somatic dysfunction of cervical region: Secondary | ICD-10-CM | POA: Diagnosis not present

## 2022-05-14 DIAGNOSIS — M5032 Other cervical disc degeneration, mid-cervical region, unspecified level: Secondary | ICD-10-CM | POA: Diagnosis not present

## 2022-05-14 DIAGNOSIS — M9902 Segmental and somatic dysfunction of thoracic region: Secondary | ICD-10-CM | POA: Diagnosis not present

## 2022-05-17 DIAGNOSIS — Z6826 Body mass index (BMI) 26.0-26.9, adult: Secondary | ICD-10-CM | POA: Diagnosis not present

## 2022-05-17 DIAGNOSIS — C9210 Chronic myeloid leukemia, BCR/ABL-positive, not having achieved remission: Secondary | ICD-10-CM | POA: Diagnosis not present

## 2022-05-17 DIAGNOSIS — Z87891 Personal history of nicotine dependence: Secondary | ICD-10-CM | POA: Diagnosis not present

## 2022-05-17 DIAGNOSIS — G4733 Obstructive sleep apnea (adult) (pediatric): Secondary | ICD-10-CM | POA: Diagnosis not present

## 2022-05-17 DIAGNOSIS — Z9221 Personal history of antineoplastic chemotherapy: Secondary | ICD-10-CM | POA: Diagnosis not present

## 2022-05-17 DIAGNOSIS — Z885 Allergy status to narcotic agent status: Secondary | ICD-10-CM | POA: Diagnosis not present

## 2022-05-17 DIAGNOSIS — Z8616 Personal history of COVID-19: Secondary | ICD-10-CM | POA: Diagnosis not present

## 2022-05-17 DIAGNOSIS — I252 Old myocardial infarction: Secondary | ICD-10-CM | POA: Diagnosis not present

## 2022-05-25 DIAGNOSIS — N400 Enlarged prostate without lower urinary tract symptoms: Secondary | ICD-10-CM | POA: Diagnosis not present

## 2022-05-25 DIAGNOSIS — Z125 Encounter for screening for malignant neoplasm of prostate: Secondary | ICD-10-CM | POA: Diagnosis not present

## 2022-05-25 DIAGNOSIS — Z0000 Encounter for general adult medical examination without abnormal findings: Secondary | ICD-10-CM | POA: Diagnosis not present

## 2022-05-25 DIAGNOSIS — K219 Gastro-esophageal reflux disease without esophagitis: Secondary | ICD-10-CM | POA: Diagnosis not present

## 2022-05-25 DIAGNOSIS — F411 Generalized anxiety disorder: Secondary | ICD-10-CM | POA: Diagnosis not present

## 2022-05-25 DIAGNOSIS — Z23 Encounter for immunization: Secondary | ICD-10-CM | POA: Diagnosis not present

## 2022-05-25 DIAGNOSIS — C9210 Chronic myeloid leukemia, BCR/ABL-positive, not having achieved remission: Secondary | ICD-10-CM | POA: Diagnosis not present

## 2022-05-25 DIAGNOSIS — G4700 Insomnia, unspecified: Secondary | ICD-10-CM | POA: Diagnosis not present

## 2022-06-11 DIAGNOSIS — C9210 Chronic myeloid leukemia, BCR/ABL-positive, not having achieved remission: Secondary | ICD-10-CM | POA: Diagnosis not present

## 2022-06-14 DIAGNOSIS — G4733 Obstructive sleep apnea (adult) (pediatric): Secondary | ICD-10-CM | POA: Diagnosis not present

## 2022-06-16 DIAGNOSIS — G4733 Obstructive sleep apnea (adult) (pediatric): Secondary | ICD-10-CM | POA: Diagnosis not present

## 2022-06-18 DIAGNOSIS — M9901 Segmental and somatic dysfunction of cervical region: Secondary | ICD-10-CM | POA: Diagnosis not present

## 2022-06-18 DIAGNOSIS — M5032 Other cervical disc degeneration, mid-cervical region, unspecified level: Secondary | ICD-10-CM | POA: Diagnosis not present

## 2022-06-18 DIAGNOSIS — M9902 Segmental and somatic dysfunction of thoracic region: Secondary | ICD-10-CM | POA: Diagnosis not present

## 2022-06-18 DIAGNOSIS — M531 Cervicobrachial syndrome: Secondary | ICD-10-CM | POA: Diagnosis not present

## 2022-07-05 DIAGNOSIS — M7711 Lateral epicondylitis, right elbow: Secondary | ICD-10-CM | POA: Diagnosis not present

## 2022-07-11 DIAGNOSIS — C9210 Chronic myeloid leukemia, BCR/ABL-positive, not having achieved remission: Secondary | ICD-10-CM | POA: Diagnosis not present

## 2022-07-13 DIAGNOSIS — M25521 Pain in right elbow: Secondary | ICD-10-CM | POA: Diagnosis not present

## 2022-07-15 DIAGNOSIS — G4733 Obstructive sleep apnea (adult) (pediatric): Secondary | ICD-10-CM | POA: Diagnosis not present

## 2022-07-18 DIAGNOSIS — M9902 Segmental and somatic dysfunction of thoracic region: Secondary | ICD-10-CM | POA: Diagnosis not present

## 2022-07-18 DIAGNOSIS — M5032 Other cervical disc degeneration, mid-cervical region, unspecified level: Secondary | ICD-10-CM | POA: Diagnosis not present

## 2022-07-18 DIAGNOSIS — M9901 Segmental and somatic dysfunction of cervical region: Secondary | ICD-10-CM | POA: Diagnosis not present

## 2022-07-18 DIAGNOSIS — M531 Cervicobrachial syndrome: Secondary | ICD-10-CM | POA: Diagnosis not present

## 2022-08-08 DIAGNOSIS — C9210 Chronic myeloid leukemia, BCR/ABL-positive, not having achieved remission: Secondary | ICD-10-CM | POA: Diagnosis not present

## 2022-08-14 DIAGNOSIS — G4733 Obstructive sleep apnea (adult) (pediatric): Secondary | ICD-10-CM | POA: Diagnosis not present

## 2022-08-20 DIAGNOSIS — C9210 Chronic myeloid leukemia, BCR/ABL-positive, not having achieved remission: Secondary | ICD-10-CM | POA: Diagnosis not present

## 2022-08-21 DIAGNOSIS — M9902 Segmental and somatic dysfunction of thoracic region: Secondary | ICD-10-CM | POA: Diagnosis not present

## 2022-08-21 DIAGNOSIS — M5032 Other cervical disc degeneration, mid-cervical region, unspecified level: Secondary | ICD-10-CM | POA: Diagnosis not present

## 2022-08-21 DIAGNOSIS — M9901 Segmental and somatic dysfunction of cervical region: Secondary | ICD-10-CM | POA: Diagnosis not present

## 2022-08-21 DIAGNOSIS — M531 Cervicobrachial syndrome: Secondary | ICD-10-CM | POA: Diagnosis not present

## 2022-09-10 DIAGNOSIS — M7711 Lateral epicondylitis, right elbow: Secondary | ICD-10-CM | POA: Diagnosis not present

## 2022-09-13 DIAGNOSIS — C9210 Chronic myeloid leukemia, BCR/ABL-positive, not having achieved remission: Secondary | ICD-10-CM | POA: Diagnosis not present

## 2022-10-18 DIAGNOSIS — C9210 Chronic myeloid leukemia, BCR/ABL-positive, not having achieved remission: Secondary | ICD-10-CM | POA: Diagnosis not present

## 2022-10-24 ENCOUNTER — Ambulatory Visit: Payer: BC Managed Care – PPO | Admitting: Cardiology

## 2022-10-31 ENCOUNTER — Ambulatory Visit: Payer: BC Managed Care – PPO | Admitting: Cardiology

## 2022-11-12 DIAGNOSIS — M5032 Other cervical disc degeneration, mid-cervical region, unspecified level: Secondary | ICD-10-CM | POA: Diagnosis not present

## 2022-11-12 DIAGNOSIS — M531 Cervicobrachial syndrome: Secondary | ICD-10-CM | POA: Diagnosis not present

## 2022-11-12 DIAGNOSIS — M9902 Segmental and somatic dysfunction of thoracic region: Secondary | ICD-10-CM | POA: Diagnosis not present

## 2022-11-12 DIAGNOSIS — M9901 Segmental and somatic dysfunction of cervical region: Secondary | ICD-10-CM | POA: Diagnosis not present

## 2022-11-13 NOTE — Progress Notes (Unsigned)
Primary Physician/Referring:  Kathyrn Lass, MD  Patient ID: [name] [name], male    DOB: Oct 05, 1965, 57 y.o.   MRN: [id number]  No chief complaint on file.  HPI:    [name] [name]  is a 57 y.o. Caucasian male with CML presently in remission, who is fairly active presently on immunotherapy for CML, as it has cardiovascular effects, specifically prolongation of QT interval, also for evaluation of a mitral valve prolapse and mild hyperlipidemia.  Negative for diabetes or hypertension.  Negative for diabetes or hypertension.  He remains asymptomatic.  Past Medical History:  Diagnosis Date   Arthritis    CML (chronic myelocytic leukemia) ([place])    CML (chronic myeloid leukemia) ([place])    Coronary artery vasospasm (HCC)    History of MI secondary to coronary artery vasospasm (1999)   Depression    GERD (gastroesophageal reflux disease)    History of hiatal hernia    Hyperlipidemia    No pertinent past medical history    OSA (obstructive sleep apnea)    mild so patient did not want to be on CPAP   PONV (postoperative nausea and vomiting)    Sinus disorder    Wears contact lenses    Past Surgical History:  Procedure Laterality Date   CARDIAC CATHETERIZATION  99,04   both normal, chest pain felt secondary to coronary vasospasm   COLONOSCOPY     HAND / FINGER LESION EXCISION  2009   fx lt metacarpal   HARDWARE REMOVAL Right 11/25/2019   Procedure: HARDWARE REMOVAL;  Surgeon: Shona Needles, MD;  Location: [place];  Service: Orthopedics;  Laterality: Right;   KNEE ARTHROSCOPY Right 11/25/2019   Procedure: Arthroscopy Knee;  Surgeon: Hiram Gash, MD;  Location: [place];  Service: Orthopedics;  Laterality: Right;   NASAL SEPTOPLASTY W/ TURBINOPLASTY  08/13/2011   Procedure: NASAL SEPTOPLASTY WITH TURBINATE REDUCTION;  Surgeon: Tyson Alias;  Location: [place];  Service: ENT;  Laterality: N/A;  with LAUP   ORIF TIBIA PLATEAU Right 06/03/2019   Procedure: OPEN REDUCTION  INTERNAL FIXATION (ORIF) TIBIAL PLATEAU;  Surgeon: Shona Needles, MD;  Location: [place];  Service: Orthopedics;  Laterality: Right;   TONSILLECTOMY     Family History  Problem Relation Age of Onset   Breast cancer Mother    Stroke Father 54       Brain bleed stroke   Other Father        Pacemaker    Social History   Tobacco Use   Smoking status: Former    Types: Cigarettes    Quit date: 02/01/1998    Years since quitting: 24.7   Smokeless tobacco: Never   Tobacco comments:    quit smoking cigarettes in 1999  Substance Use Topics   Alcohol use: Yes    Alcohol/week: 1.0 standard drink of alcohol    Types: 1 Glasses of wine per week    Comment: occasional   Marital Status: Married  ROS  Review of Systems  Cardiovascular:  Negative for chest pain, dyspnea on exertion and leg swelling.  Gastrointestinal:  Negative for melena.   Objective  There were no vitals taken for this visit.     10/23/2021   10:00 AM 10/23/2021    9:31 AM 09/15/2020    3:54 PM  Vitals with BMI  Height  '6\' 2"'$  '6\' 2"'$   Weight  193 lbs 13 oz 205 lbs  BMI  [id number]  Systolic [id number]  [id number]  Diastolic 72 70 72  Pulse  72 61     Physical Exam Neck:     Vascular: No JVD.  Cardiovascular:     Rate and Rhythm: Normal rate and regular rhythm.     Pulses: Intact distal pulses.     Heart sounds: A midsystolic click. No murmur heard.    No gallop.  Pulmonary:     Effort: Pulmonary effort is normal.     Breath sounds: Normal breath sounds.  Abdominal:     General: Bowel sounds are normal.     Palpations: Abdomen is soft.  Musculoskeletal:     Right lower leg: No edema.     Left lower leg: No edema.    Laboratory examination:   External labs:   Labs 03/29/2021:  Sodium 137, potassium 4.2, BUN 15, creatinine 1.14, EGFR 75 mill, LFTs normal.  Uric acid level normal at 6.6.  Hb 13.9/HCT 41.7, platelets 167, normal indicis.  Total cholesterol 183, triglycerides 51, HDL 66, LDL 107.  TSH normal at  0.894.  04/15/2020:  Total cholesterol 193, triglycerides 67, HDL 60. LDL 120,  Non-HDL cholesterol 133.  Medications and allergies   Allergies  Allergen Reactions   Hydrocodone Nausea Only    SEVERE NAUSEA (patient noted he CAN tolerate Morphine, Codeine, Ibuprofen, Tylenol, and Naproxen without anti-nausea meds)   Oxycodone Nausea Only    SEVERE NAUSEA (patient noted he CAN tolerate Morphine, Codeine, Ibuprofen, Tylenol, and Naproxen without anti-nausea meds)     Current Outpatient Medications:    ALPRAZolam (XANAX) 0.5 MG tablet, Take 0.25 mg by mouth 2 (two) times daily as needed for anxiety. , Disp: , Rfl:    diphenhydrAMINE (BENADRYL) 25 MG tablet, Take 25 mg by mouth at bedtime as needed for sleep. , Disp: , Rfl:    nilotinib (TASIGNA) 150 MG capsule, TAKE 2 CAPSULES (300 MG TOTAL) EVERY 12 HOURS (Patient taking differently: Take 300 mg by mouth every 12 (twelve) hours.), Disp: 112 capsule, Rfl: 6   sildenafil (VIAGRA) 25 MG tablet, Take 1 tablet (25 mg total) by mouth daily as needed for erectile dysfunction., Disp: 10 tablet, Rfl: 0  Radiology:   No results found.  Cardiac Studies:   Coronary calcium score 09/22/2020: Coronary calcium score of 1, in the RCA, otherwise normal. 40th percentile for age and sex matched control. Ascending aorta measuring 36 mm.  PCV ECHOCARDIOGRAM COMPLETE 10/13/2020  Narrative Echocardiogram 10/13/2020: Left ventricle cavity is normal in size. Mild concentric hypertrophy of the left ventricle. Normal global wall motion. Normal LV systolic function with EF 55%. Normal diastolic filling pattern. Left atrial cavity is moderately dilated. Mild bileaflet prolapse with moderate (grade II) mitral regurgitation. Mild to moderate tricuspid regurgitation. Estimated pulmonary artery systolic pressure 33 mmHg. Compared to previous study in 2017, valvular regurgitation has increased form mild MR and trace TR. Mild PH is new.   EKG:   EKGs 10/23/2021:  Normal sinus rhythm at rate of 59 bpm, normal axis, early repolarization.  Normal QT interval.  Normal EKG.   Assessment     ICD-10-CM   1. Moderate mitral regurgitation  I34.0     2. MVP (mitral valve prolapse)  I34.1     3. Mild hyperlipidemia  E78.5        There are no discontinued medications.  No orders of the defined types were placed in this encounter.  No orders of the defined types were placed in this encounter.  Recommendations:   [name] [name]  is a 57 y.o. Caucasian male with CML presently in remission, who is fairly active presently on immunotherapy for CML, as it has cardiovascular effects, specifically prolongation of QT interval, also for evaluation of a mitral valve prolapse and mild hyperlipidemia.  Negative for diabetes or hypertension.  His physical examination and EKG are normal, QT interval is normal which is needed for his monitoring of immunotherapy.  On auscultation, I do not hear any murmur today but midsystolic click is well heard.  He remains asymptomatic and continues to exercise regularly without any symptoms.  Hence we will continue to monitor this by physical exam and symptoms.  Could consider repeating echocardiogram once every 3 years unless symptoms develop.  He has mild hyperlipidemia.  LDL has improved from last year and patient does not want to be on a statin.  I reviewed his coronary calcium score which is just 1 at 40th percentile.  Initially blood pressure was mildly elevated, but on sitting and rechecking, blood pressure is normal.  I will see him back in 1 year or sooner if problems.     Adrian Prows, MD, [facility] 11/13/2022, 10:48 PM Office: [phone]

## 2022-11-14 ENCOUNTER — Encounter: Payer: Self-pay | Admitting: Cardiology

## 2022-11-14 ENCOUNTER — Ambulatory Visit: Payer: BC Managed Care – PPO | Admitting: Cardiology

## 2022-11-14 VITALS — BP 134/66 | HR 54 | Resp 16 | Ht 74.0 in | Wt 193.6 lb

## 2022-11-14 DIAGNOSIS — I77811 Abdominal aortic ectasia: Secondary | ICD-10-CM

## 2022-11-14 DIAGNOSIS — E785 Hyperlipidemia, unspecified: Secondary | ICD-10-CM

## 2022-11-14 DIAGNOSIS — I341 Nonrheumatic mitral (valve) prolapse: Secondary | ICD-10-CM

## 2022-11-14 DIAGNOSIS — I340 Nonrheumatic mitral (valve) insufficiency: Secondary | ICD-10-CM

## 2022-11-15 DIAGNOSIS — C9210 Chronic myeloid leukemia, BCR/ABL-positive, not having achieved remission: Secondary | ICD-10-CM | POA: Diagnosis not present

## 2022-11-20 DIAGNOSIS — L821 Other seborrheic keratosis: Secondary | ICD-10-CM | POA: Diagnosis not present

## 2022-11-20 DIAGNOSIS — D225 Melanocytic nevi of trunk: Secondary | ICD-10-CM | POA: Diagnosis not present

## 2022-11-20 DIAGNOSIS — L814 Other melanin hyperpigmentation: Secondary | ICD-10-CM | POA: Diagnosis not present

## 2022-11-22 DIAGNOSIS — C9210 Chronic myeloid leukemia, BCR/ABL-positive, not having achieved remission: Secondary | ICD-10-CM | POA: Diagnosis not present

## 2022-11-23 ENCOUNTER — Other Ambulatory Visit: Payer: BC Managed Care – PPO

## 2022-11-26 DIAGNOSIS — F411 Generalized anxiety disorder: Secondary | ICD-10-CM | POA: Diagnosis not present

## 2022-11-26 DIAGNOSIS — R634 Abnormal weight loss: Secondary | ICD-10-CM | POA: Diagnosis not present

## 2022-11-26 DIAGNOSIS — G4700 Insomnia, unspecified: Secondary | ICD-10-CM | POA: Diagnosis not present

## 2022-11-26 DIAGNOSIS — C9210 Chronic myeloid leukemia, BCR/ABL-positive, not having achieved remission: Secondary | ICD-10-CM | POA: Diagnosis not present

## 2022-11-28 DIAGNOSIS — F411 Generalized anxiety disorder: Secondary | ICD-10-CM | POA: Diagnosis not present

## 2022-12-05 DIAGNOSIS — F411 Generalized anxiety disorder: Secondary | ICD-10-CM | POA: Diagnosis not present

## 2022-12-07 ENCOUNTER — Ambulatory Visit: Payer: BC Managed Care – PPO

## 2022-12-07 DIAGNOSIS — I341 Nonrheumatic mitral (valve) prolapse: Secondary | ICD-10-CM

## 2022-12-07 DIAGNOSIS — I340 Nonrheumatic mitral (valve) insufficiency: Secondary | ICD-10-CM | POA: Diagnosis not present

## 2022-12-07 DIAGNOSIS — I77811 Abdominal aortic ectasia: Secondary | ICD-10-CM | POA: Diagnosis not present

## 2022-12-07 NOTE — Progress Notes (Signed)
Echocardiogram 12/07/2022:  Normal LV systolic function with visual EF 55-60%. Left ventricle cavity is normal in size. Normal left ventricular wall thickness. Normal global wall motion. Normal diastolic filling pattern, normal LAP. Calculated EF 59%. Left atrial cavity is mildly dilated. Right atrial cavity is slightly dilated. Mild to moderate mitral regurgitation. Mild prolapse of the mitral valve leaflets. Structurally normal tricuspid valve with trace regurgitation. No evidence of pulmonary hypertension. IVC is dilated with respiratory variation. No significant change compared to 03/2021.

## 2022-12-12 DIAGNOSIS — F411 Generalized anxiety disorder: Secondary | ICD-10-CM | POA: Diagnosis not present

## 2022-12-19 DIAGNOSIS — F411 Generalized anxiety disorder: Secondary | ICD-10-CM | POA: Diagnosis not present

## 2022-12-24 DIAGNOSIS — M531 Cervicobrachial syndrome: Secondary | ICD-10-CM | POA: Diagnosis not present

## 2022-12-24 DIAGNOSIS — M5032 Other cervical disc degeneration, mid-cervical region, unspecified level: Secondary | ICD-10-CM | POA: Diagnosis not present

## 2022-12-24 DIAGNOSIS — F430 Acute stress reaction: Secondary | ICD-10-CM | POA: Diagnosis not present

## 2022-12-24 DIAGNOSIS — F411 Generalized anxiety disorder: Secondary | ICD-10-CM | POA: Diagnosis not present

## 2022-12-24 DIAGNOSIS — G4700 Insomnia, unspecified: Secondary | ICD-10-CM | POA: Diagnosis not present

## 2022-12-24 DIAGNOSIS — M9901 Segmental and somatic dysfunction of cervical region: Secondary | ICD-10-CM | POA: Diagnosis not present

## 2022-12-24 DIAGNOSIS — R634 Abnormal weight loss: Secondary | ICD-10-CM | POA: Diagnosis not present

## 2022-12-24 DIAGNOSIS — M9902 Segmental and somatic dysfunction of thoracic region: Secondary | ICD-10-CM | POA: Diagnosis not present

## 2022-12-26 DIAGNOSIS — F411 Generalized anxiety disorder: Secondary | ICD-10-CM | POA: Diagnosis not present

## 2023-01-07 DIAGNOSIS — F411 Generalized anxiety disorder: Secondary | ICD-10-CM | POA: Diagnosis not present

## 2023-01-21 DIAGNOSIS — C9210 Chronic myeloid leukemia, BCR/ABL-positive, not having achieved remission: Secondary | ICD-10-CM | POA: Diagnosis not present

## 2023-01-22 DIAGNOSIS — M5032 Other cervical disc degeneration, mid-cervical region, unspecified level: Secondary | ICD-10-CM | POA: Diagnosis not present

## 2023-01-22 DIAGNOSIS — M9902 Segmental and somatic dysfunction of thoracic region: Secondary | ICD-10-CM | POA: Diagnosis not present

## 2023-01-22 DIAGNOSIS — M531 Cervicobrachial syndrome: Secondary | ICD-10-CM | POA: Diagnosis not present

## 2023-01-22 DIAGNOSIS — M9901 Segmental and somatic dysfunction of cervical region: Secondary | ICD-10-CM | POA: Diagnosis not present

## 2023-01-23 DIAGNOSIS — F411 Generalized anxiety disorder: Secondary | ICD-10-CM | POA: Diagnosis not present

## 2023-01-25 DIAGNOSIS — M25622 Stiffness of left elbow, not elsewhere classified: Secondary | ICD-10-CM | POA: Diagnosis not present

## 2023-01-25 DIAGNOSIS — M79622 Pain in left upper arm: Secondary | ICD-10-CM | POA: Diagnosis not present

## 2023-01-25 DIAGNOSIS — M7711 Lateral epicondylitis, right elbow: Secondary | ICD-10-CM | POA: Diagnosis not present

## 2023-01-29 DIAGNOSIS — M7711 Lateral epicondylitis, right elbow: Secondary | ICD-10-CM | POA: Diagnosis not present

## 2023-01-29 DIAGNOSIS — M25622 Stiffness of left elbow, not elsewhere classified: Secondary | ICD-10-CM | POA: Diagnosis not present

## 2023-01-29 DIAGNOSIS — M79622 Pain in left upper arm: Secondary | ICD-10-CM | POA: Diagnosis not present

## 2023-01-30 DIAGNOSIS — F411 Generalized anxiety disorder: Secondary | ICD-10-CM | POA: Diagnosis not present

## 2023-01-31 DIAGNOSIS — C9210 Chronic myeloid leukemia, BCR/ABL-positive, not having achieved remission: Secondary | ICD-10-CM | POA: Diagnosis not present

## 2023-01-31 DIAGNOSIS — M79622 Pain in left upper arm: Secondary | ICD-10-CM | POA: Diagnosis not present

## 2023-01-31 DIAGNOSIS — M25622 Stiffness of left elbow, not elsewhere classified: Secondary | ICD-10-CM | POA: Diagnosis not present

## 2023-01-31 DIAGNOSIS — M7711 Lateral epicondylitis, right elbow: Secondary | ICD-10-CM | POA: Diagnosis not present

## 2023-02-05 DIAGNOSIS — M7711 Lateral epicondylitis, right elbow: Secondary | ICD-10-CM | POA: Diagnosis not present

## 2023-02-05 DIAGNOSIS — M25622 Stiffness of left elbow, not elsewhere classified: Secondary | ICD-10-CM | POA: Diagnosis not present

## 2023-02-05 DIAGNOSIS — M79622 Pain in left upper arm: Secondary | ICD-10-CM | POA: Diagnosis not present

## 2023-02-08 DIAGNOSIS — M79622 Pain in left upper arm: Secondary | ICD-10-CM | POA: Diagnosis not present

## 2023-02-08 DIAGNOSIS — M25622 Stiffness of left elbow, not elsewhere classified: Secondary | ICD-10-CM | POA: Diagnosis not present

## 2023-02-08 DIAGNOSIS — M7711 Lateral epicondylitis, right elbow: Secondary | ICD-10-CM | POA: Diagnosis not present

## 2023-02-11 DIAGNOSIS — F411 Generalized anxiety disorder: Secondary | ICD-10-CM | POA: Diagnosis not present

## 2023-02-12 DIAGNOSIS — M79622 Pain in left upper arm: Secondary | ICD-10-CM | POA: Diagnosis not present

## 2023-02-12 DIAGNOSIS — M25622 Stiffness of left elbow, not elsewhere classified: Secondary | ICD-10-CM | POA: Diagnosis not present

## 2023-02-12 DIAGNOSIS — M7711 Lateral epicondylitis, right elbow: Secondary | ICD-10-CM | POA: Diagnosis not present

## 2023-02-15 DIAGNOSIS — M79622 Pain in left upper arm: Secondary | ICD-10-CM | POA: Diagnosis not present

## 2023-02-15 DIAGNOSIS — M7711 Lateral epicondylitis, right elbow: Secondary | ICD-10-CM | POA: Diagnosis not present

## 2023-02-15 DIAGNOSIS — M25622 Stiffness of left elbow, not elsewhere classified: Secondary | ICD-10-CM | POA: Diagnosis not present

## 2023-02-18 DIAGNOSIS — F411 Generalized anxiety disorder: Secondary | ICD-10-CM | POA: Diagnosis not present

## 2023-02-19 DIAGNOSIS — M7711 Lateral epicondylitis, right elbow: Secondary | ICD-10-CM | POA: Diagnosis not present

## 2023-02-19 DIAGNOSIS — M79622 Pain in left upper arm: Secondary | ICD-10-CM | POA: Diagnosis not present

## 2023-02-19 DIAGNOSIS — M25622 Stiffness of left elbow, not elsewhere classified: Secondary | ICD-10-CM | POA: Diagnosis not present

## 2023-02-21 DIAGNOSIS — M7711 Lateral epicondylitis, right elbow: Secondary | ICD-10-CM | POA: Diagnosis not present

## 2023-02-21 DIAGNOSIS — M25622 Stiffness of left elbow, not elsewhere classified: Secondary | ICD-10-CM | POA: Diagnosis not present

## 2023-02-21 DIAGNOSIS — M79622 Pain in left upper arm: Secondary | ICD-10-CM | POA: Diagnosis not present

## 2023-02-25 DIAGNOSIS — M25622 Stiffness of left elbow, not elsewhere classified: Secondary | ICD-10-CM | POA: Diagnosis not present

## 2023-02-25 DIAGNOSIS — M79622 Pain in left upper arm: Secondary | ICD-10-CM | POA: Diagnosis not present

## 2023-02-25 DIAGNOSIS — M7711 Lateral epicondylitis, right elbow: Secondary | ICD-10-CM | POA: Diagnosis not present

## 2023-02-28 DIAGNOSIS — M25622 Stiffness of left elbow, not elsewhere classified: Secondary | ICD-10-CM | POA: Diagnosis not present

## 2023-02-28 DIAGNOSIS — M79622 Pain in left upper arm: Secondary | ICD-10-CM | POA: Diagnosis not present

## 2023-02-28 DIAGNOSIS — M7711 Lateral epicondylitis, right elbow: Secondary | ICD-10-CM | POA: Diagnosis not present

## 2023-03-04 DIAGNOSIS — C9210 Chronic myeloid leukemia, BCR/ABL-positive, not having achieved remission: Secondary | ICD-10-CM | POA: Diagnosis not present

## 2023-03-05 DIAGNOSIS — M79622 Pain in left upper arm: Secondary | ICD-10-CM | POA: Diagnosis not present

## 2023-03-05 DIAGNOSIS — M25622 Stiffness of left elbow, not elsewhere classified: Secondary | ICD-10-CM | POA: Diagnosis not present

## 2023-03-05 DIAGNOSIS — M7711 Lateral epicondylitis, right elbow: Secondary | ICD-10-CM | POA: Diagnosis not present

## 2023-03-06 DIAGNOSIS — F411 Generalized anxiety disorder: Secondary | ICD-10-CM | POA: Diagnosis not present

## 2023-03-14 DIAGNOSIS — M7711 Lateral epicondylitis, right elbow: Secondary | ICD-10-CM | POA: Diagnosis not present

## 2023-03-14 DIAGNOSIS — M79622 Pain in left upper arm: Secondary | ICD-10-CM | POA: Diagnosis not present

## 2023-03-14 DIAGNOSIS — M25622 Stiffness of left elbow, not elsewhere classified: Secondary | ICD-10-CM | POA: Diagnosis not present

## 2023-03-18 DIAGNOSIS — Z885 Allergy status to narcotic agent status: Secondary | ICD-10-CM | POA: Diagnosis not present

## 2023-03-18 DIAGNOSIS — M79622 Pain in left upper arm: Secondary | ICD-10-CM | POA: Diagnosis not present

## 2023-03-18 DIAGNOSIS — M7711 Lateral epicondylitis, right elbow: Secondary | ICD-10-CM | POA: Diagnosis not present

## 2023-03-18 DIAGNOSIS — C9210 Chronic myeloid leukemia, BCR/ABL-positive, not having achieved remission: Secondary | ICD-10-CM | POA: Diagnosis not present

## 2023-03-18 DIAGNOSIS — M25622 Stiffness of left elbow, not elsewhere classified: Secondary | ICD-10-CM | POA: Diagnosis not present

## 2023-03-20 DIAGNOSIS — F411 Generalized anxiety disorder: Secondary | ICD-10-CM | POA: Diagnosis not present

## 2023-03-25 DIAGNOSIS — M79622 Pain in left upper arm: Secondary | ICD-10-CM | POA: Diagnosis not present

## 2023-03-25 DIAGNOSIS — M25622 Stiffness of left elbow, not elsewhere classified: Secondary | ICD-10-CM | POA: Diagnosis not present

## 2023-03-25 DIAGNOSIS — M7711 Lateral epicondylitis, right elbow: Secondary | ICD-10-CM | POA: Diagnosis not present

## 2023-03-27 DIAGNOSIS — M531 Cervicobrachial syndrome: Secondary | ICD-10-CM | POA: Diagnosis not present

## 2023-03-27 DIAGNOSIS — M9902 Segmental and somatic dysfunction of thoracic region: Secondary | ICD-10-CM | POA: Diagnosis not present

## 2023-03-27 DIAGNOSIS — M5032 Other cervical disc degeneration, mid-cervical region, unspecified level: Secondary | ICD-10-CM | POA: Diagnosis not present

## 2023-03-27 DIAGNOSIS — M9901 Segmental and somatic dysfunction of cervical region: Secondary | ICD-10-CM | POA: Diagnosis not present

## 2023-04-03 DIAGNOSIS — F411 Generalized anxiety disorder: Secondary | ICD-10-CM | POA: Diagnosis not present

## 2023-04-03 DIAGNOSIS — C9210 Chronic myeloid leukemia, BCR/ABL-positive, not having achieved remission: Secondary | ICD-10-CM | POA: Diagnosis not present

## 2023-04-03 DIAGNOSIS — M79622 Pain in left upper arm: Secondary | ICD-10-CM | POA: Diagnosis not present

## 2023-04-03 DIAGNOSIS — M25622 Stiffness of left elbow, not elsewhere classified: Secondary | ICD-10-CM | POA: Diagnosis not present

## 2023-04-03 DIAGNOSIS — M7711 Lateral epicondylitis, right elbow: Secondary | ICD-10-CM | POA: Diagnosis not present

## 2023-04-08 DIAGNOSIS — M79622 Pain in left upper arm: Secondary | ICD-10-CM | POA: Diagnosis not present

## 2023-04-08 DIAGNOSIS — M7711 Lateral epicondylitis, right elbow: Secondary | ICD-10-CM | POA: Diagnosis not present

## 2023-04-08 DIAGNOSIS — M25622 Stiffness of left elbow, not elsewhere classified: Secondary | ICD-10-CM | POA: Diagnosis not present

## 2023-04-11 DIAGNOSIS — M25622 Stiffness of left elbow, not elsewhere classified: Secondary | ICD-10-CM | POA: Diagnosis not present

## 2023-04-11 DIAGNOSIS — M79622 Pain in left upper arm: Secondary | ICD-10-CM | POA: Diagnosis not present

## 2023-04-11 DIAGNOSIS — M7711 Lateral epicondylitis, right elbow: Secondary | ICD-10-CM | POA: Diagnosis not present

## 2023-04-23 DIAGNOSIS — F4321 Adjustment disorder with depressed mood: Secondary | ICD-10-CM | POA: Diagnosis not present

## 2023-04-25 DIAGNOSIS — M25522 Pain in left elbow: Secondary | ICD-10-CM | POA: Diagnosis not present

## 2023-04-29 DIAGNOSIS — F411 Generalized anxiety disorder: Secondary | ICD-10-CM | POA: Diagnosis not present

## 2023-04-29 DIAGNOSIS — C9210 Chronic myeloid leukemia, BCR/ABL-positive, not having achieved remission: Secondary | ICD-10-CM | POA: Diagnosis not present

## 2023-05-07 DIAGNOSIS — F4321 Adjustment disorder with depressed mood: Secondary | ICD-10-CM | POA: Diagnosis not present

## 2023-05-08 DIAGNOSIS — C9210 Chronic myeloid leukemia, BCR/ABL-positive, not having achieved remission: Secondary | ICD-10-CM | POA: Diagnosis not present

## 2023-05-14 DIAGNOSIS — F4321 Adjustment disorder with depressed mood: Secondary | ICD-10-CM | POA: Diagnosis not present

## 2023-05-15 DIAGNOSIS — F411 Generalized anxiety disorder: Secondary | ICD-10-CM | POA: Diagnosis not present

## 2023-05-21 DIAGNOSIS — F4321 Adjustment disorder with depressed mood: Secondary | ICD-10-CM | POA: Diagnosis not present

## 2023-05-22 DIAGNOSIS — M9901 Segmental and somatic dysfunction of cervical region: Secondary | ICD-10-CM | POA: Diagnosis not present

## 2023-05-22 DIAGNOSIS — M5032 Other cervical disc degeneration, mid-cervical region, unspecified level: Secondary | ICD-10-CM | POA: Diagnosis not present

## 2023-05-22 DIAGNOSIS — M9902 Segmental and somatic dysfunction of thoracic region: Secondary | ICD-10-CM | POA: Diagnosis not present

## 2023-05-22 DIAGNOSIS — M531 Cervicobrachial syndrome: Secondary | ICD-10-CM | POA: Diagnosis not present

## 2023-05-29 DIAGNOSIS — I340 Nonrheumatic mitral (valve) insufficiency: Secondary | ICD-10-CM | POA: Diagnosis not present

## 2023-05-29 DIAGNOSIS — Z23 Encounter for immunization: Secondary | ICD-10-CM | POA: Diagnosis not present

## 2023-05-29 DIAGNOSIS — N529 Male erectile dysfunction, unspecified: Secondary | ICD-10-CM | POA: Diagnosis not present

## 2023-05-29 DIAGNOSIS — C9210 Chronic myeloid leukemia, BCR/ABL-positive, not having achieved remission: Secondary | ICD-10-CM | POA: Diagnosis not present

## 2023-05-29 DIAGNOSIS — Z0000 Encounter for general adult medical examination without abnormal findings: Secondary | ICD-10-CM | POA: Diagnosis not present

## 2023-05-29 DIAGNOSIS — I77811 Abdominal aortic ectasia: Secondary | ICD-10-CM | POA: Diagnosis not present

## 2023-05-30 DIAGNOSIS — F4321 Adjustment disorder with depressed mood: Secondary | ICD-10-CM | POA: Diagnosis not present

## 2023-06-24 DIAGNOSIS — M5032 Other cervical disc degeneration, mid-cervical region, unspecified level: Secondary | ICD-10-CM | POA: Diagnosis not present

## 2023-06-24 DIAGNOSIS — M9901 Segmental and somatic dysfunction of cervical region: Secondary | ICD-10-CM | POA: Diagnosis not present

## 2023-06-24 DIAGNOSIS — M531 Cervicobrachial syndrome: Secondary | ICD-10-CM | POA: Diagnosis not present

## 2023-06-24 DIAGNOSIS — M9902 Segmental and somatic dysfunction of thoracic region: Secondary | ICD-10-CM | POA: Diagnosis not present

## 2023-06-26 DIAGNOSIS — C9210 Chronic myeloid leukemia, BCR/ABL-positive, not having achieved remission: Secondary | ICD-10-CM | POA: Diagnosis not present

## 2023-07-01 DIAGNOSIS — Z885 Allergy status to narcotic agent status: Secondary | ICD-10-CM | POA: Diagnosis not present

## 2023-07-01 DIAGNOSIS — I252 Old myocardial infarction: Secondary | ICD-10-CM | POA: Diagnosis not present

## 2023-07-01 DIAGNOSIS — C9210 Chronic myeloid leukemia, BCR/ABL-positive, not having achieved remission: Secondary | ICD-10-CM | POA: Diagnosis not present

## 2023-07-01 DIAGNOSIS — Z87891 Personal history of nicotine dependence: Secondary | ICD-10-CM | POA: Diagnosis not present

## 2023-07-01 DIAGNOSIS — D7589 Other specified diseases of blood and blood-forming organs: Secondary | ICD-10-CM | POA: Diagnosis not present

## 2023-08-15 DIAGNOSIS — C9210 Chronic myeloid leukemia, BCR/ABL-positive, not having achieved remission: Secondary | ICD-10-CM | POA: Diagnosis not present

## 2023-08-20 DIAGNOSIS — M9901 Segmental and somatic dysfunction of cervical region: Secondary | ICD-10-CM | POA: Diagnosis not present

## 2023-08-20 DIAGNOSIS — M9902 Segmental and somatic dysfunction of thoracic region: Secondary | ICD-10-CM | POA: Diagnosis not present

## 2023-08-20 DIAGNOSIS — M5032 Other cervical disc degeneration, mid-cervical region, unspecified level: Secondary | ICD-10-CM | POA: Diagnosis not present

## 2023-08-20 DIAGNOSIS — M531 Cervicobrachial syndrome: Secondary | ICD-10-CM | POA: Diagnosis not present

## 2023-08-21 DIAGNOSIS — C9210 Chronic myeloid leukemia, BCR/ABL-positive, not having achieved remission: Secondary | ICD-10-CM | POA: Diagnosis not present

## 2023-10-24 ENCOUNTER — Other Ambulatory Visit: Payer: Self-pay | Admitting: Medical Genetics

## 2023-11-05 ENCOUNTER — Other Ambulatory Visit (HOSPITAL_COMMUNITY)
Admission: RE | Admit: 2023-11-05 | Discharge: 2023-11-05 | Disposition: A | Discharge status: Home / Self Care | Payer: Self-pay | Source: Ambulatory Visit | Attending: Medical Genetics | Admitting: Medical Genetics

## 2023-11-12 LAB — GENECONNECT MOLECULAR SCREEN: Genetic Analysis Overall Interpretation: NEGATIVE

## 2024-02-18 ENCOUNTER — Telehealth: Payer: Self-pay | Admitting: Cardiology

## 2024-02-18 NOTE — Telephone Encounter (Signed)
 Patient reports he started having a fluttering sensation in the upper part of his chest. He states when this occurs he is also aware of a feeling of increased pressure in his head/neck. He denies any pain, no CP, no SOB, no fatigue.   Episodes are intermittent, lasting 10 seconds, at most 30-60 seconds. Patient reports he has been more active this past weekend with moving his home. He states he may not have been drinking enough water with the amount of movement he's been doing along with the hotter weather we've had.  He reports episodes were more frequent Sunday, and have gradually become less frequent each day.  Advised patient to increase water intake to see if this helps while we wait to see what Dr. Berry Bristol recommends.  Patient is overdue for 1 year F/U, and would like to see Dr. Berry Bristol but no availability until August.

## 2024-02-18 NOTE — Telephone Encounter (Signed)
 Patient c/o Palpitations:  STAT if patient reporting lightheadedness, shortness of breath, or chest pain  How long have you had palpitations/irregular HR/ Afib? Are you having the symptoms now? Started Sunday, is not having any symptoms now  Are you currently experiencing lightheadedness, SOB or CP? No   Do you have a history of afib (atrial fibrillation) or irregular heart rhythm? No   Have you checked your BP or HR? (document readings if available): No  Are you experiencing any other symptoms? Pressure in neck and head when fluttering occurs.    States he had symptoms this morning, but none at time of call.

## 2024-02-18 NOTE — Telephone Encounter (Signed)
 Routine visit, [name] [name] is finie unless he has more symptoms to message us  on MyChart

## 2024-02-19 NOTE — Telephone Encounter (Signed)
 Patient notified.  He will let us  know if he has more symptoms.  Appointment made for patient to see Dr Berry Bristol on August 14

## 2024-04-16 ENCOUNTER — Encounter: Payer: Self-pay | Admitting: Cardiology

## 2024-04-16 ENCOUNTER — Ambulatory Visit: Attending: Cardiology | Admitting: Cardiology

## 2024-04-16 VITALS — BP 128/70 | HR 54 | Resp 16 | Ht 74.0 in | Wt 196.2 lb

## 2024-04-16 DIAGNOSIS — I77811 Abdominal aortic ectasia: Secondary | ICD-10-CM

## 2024-04-16 DIAGNOSIS — E785 Hyperlipidemia, unspecified: Secondary | ICD-10-CM | POA: Diagnosis not present

## 2024-04-16 DIAGNOSIS — I341 Nonrheumatic mitral (valve) prolapse: Secondary | ICD-10-CM | POA: Diagnosis not present

## 2024-04-16 DIAGNOSIS — R002 Palpitations: Secondary | ICD-10-CM | POA: Diagnosis not present

## 2024-04-16 NOTE — Progress Notes (Signed)
 Cardiology Office Note:  .   Date:  04/16/2024  ID:  [name] [name], DOB [birth year], MRN [id number] PCP: Cleotilde Planas, MD  [facility] Providers Cardiologist:  Gordy Bergamo, MD   History of Present Illness: .   [name] [name] is a 58 y.o. Caucasian male with CML presently in remission, who is fairly active presently on immunotherapy for CML, as it has cardiovascular effects, specifically prolongation of QT interval, also for evaluation of a mitral valve prolapse and mild hyperlipidemia. Negative for diabetes or hypertension.   Cardiac Studies relevent.    Coronary calcium score 09/22/2020: Coronary calcium score of 1, in the RCA, otherwise normal. 40th percentile for age and sex matched control. Ascending aorta measuring 36 mm.   Abdominal Aortic Duplex 12/07/2022:  No AAA observed. The maximum aorta (sac) diameter is 2.83 cm (prox).  Minimal ectasia noted in the proximal aorta.  Overall no change in the serial studies since 02/09/2016.   Echocardiogram 12/07/2022: Normal LV systolic function, EF 55-60%.  Normal diastolic function. Mild left atrial lodgment Mild right atrial enlargement Mild to moderate MR with mild prolapse of mitral valve leaflets. Trace tricuspid regurgitation. No significant change compared to 03/25/2021.     Discussed the use of AI scribe software for clinical note transcription with the patient, who gave verbal consent to proceed.  History of Present Illness [name] [name] is a 58 year old male with chronic myeloid leukemia who presents for routine follow-up.  He experienced palpitations a couple of months ago, associated with dehydration. Currently, he has occasional heart skipping beats at rest, with no symptoms during daily physical activity. No persistent palpitations during exercise.  He has a history of mitral valve prolapse and underwent an echocardiogram in March 2024. He has not experienced severe shortness of breath.  He has mild abdominal  aortic ectasia with prior imaging of his aorta. No family history of aortic dissection or rupture.  He has sleep apnea, diagnosed when he was heavier, and owns a CPAP machine but does not use it regularly.  He is not on lipid-lowering therapy. His LDL was previously 113, decreased with weight loss and exercise. He is due for a lipid panel and lipoprotein A test today.  Labs   Care everywhere/Faxed External Labs:  Labs 10/18/2022:   Sodium 138, potassium 3.9, BUN 13, creatinine 1.04, EGFR 83 mL.  LFTs normal.   Hb 14.8/HCT 44.6, platelets 206, normal indicis.   Labs 05/17/2022:   Total cholesterol 187, triglycerides 58, HDL 62, LDL 113.  Non-HDL cholesterol 125.  ROS  Review of Systems  Cardiovascular:  Positive for palpitations. Negative for chest pain, dyspnea on exertion and leg swelling.   Physical Exam:   VS:  BP 128/70 (BP Location: Left Arm, Patient Position: Sitting, Cuff Size: Normal)   Pulse (!) 54   Resp 16   Ht 6' 2 (1.88 m)   Wt 196 lb 3.2 oz (89 kg)   SpO2 98%   BMI 25.19 kg/m    Wt Readings from Last 3 Encounters:  04/16/24 196 lb 3.2 oz (89 kg)  11/14/22 193 lb 9.6 oz (87.8 kg)  10/23/21 193 lb 12.8 oz (87.9 kg)    BP Readings from Last 3 Encounters:  04/16/24 128/70  11/14/22 134/66  10/23/21 126/72   Physical Exam Neck:     Vascular: No JVD.  Cardiovascular:     Rate and Rhythm: Normal rate and regular rhythm.     Pulses: Intact distal pulses.  Heart sounds: S1 normal and S2 normal. Murmur heard.     Mid to late systolic murmur is present with a grade of 2/6 at the apex.     No gallop.  Pulmonary:     Effort: Pulmonary effort is normal.     Breath sounds: Normal breath sounds.  Abdominal:     General: Bowel sounds are normal.     Palpations: Abdomen is soft.  Musculoskeletal:     Right lower leg: No edema.     Left lower leg: No edema.    EKG:    EKG Interpretation Date/Time:  Thursday April 16 2024 08:40:10 EDT Ventricular Rate:   53 PR Interval:  156 QRS Duration:  98 QT Interval:  412 QTC Calculation: 386 R Axis:   43  Text Interpretation: EKG 04/16/2024: Normal sinus rhythm at rate of 53 bpm, normal EKG.  No significant change from 11/25/2019. Confirmed by Elorah Dewing, Jagadeesh [phone]) on 04/16/2024 8:48:31 AM  EKG 11/14/2022: Normal sinus rhythm at rate of 57 beats minute, normal axis, no evidence of ischemia, normal EKG.   ASSESSMENT AND PLAN: .      ICD-10-CM   1. MVP (mitral valve prolapse)  I34.1 EKG 12-Lead    2. Mild hyperlipidemia  E78.5 Lipid Profile    Lipoprotein A (LPA)    3. Abdominal aortic ectasia (HCC)  I77.811     4. Palpitations  R00.2      Assessment & Plan Chronic myeloid leukemia on active treatment Chronic myeloid leukemia is being actively managed with Symbyax. Current dosage is 80 mg once daily. Laboratory results, including CBC, are normal, indicating stable disease control. - Continue Symbyax 80 mg once daily - Monitor CBC and CMP as per oncology follow-up schedule  Mitral valve prolapse with mild to moderate mitral regurgitation Mitral valve prolapse with mild to moderate mitral regurgitation is well-managed. Auscultation reveals a mitral valve click, consistent with previous findings. Echocardiogram from March 2024 showed mild to moderate regurgitation, and no repeat echo is needed at this time. - Monitor for symptoms of worsening mitral regurgitation, such as severe shortness of breath  Intermittent palpitations, likely benign Intermittent palpitations are likely benign, possibly due to dehydration, stress, lack of sleep, or caffeine intake. No recent episodes reported. Explained that palpitations at rest can be due to premature atrial or ventricular complexes. - Monitor for palpitations during exercise - Ensure adequate hydration and manage stress and caffeine intake  Stable abdominal aortic ectasia Abdominal aortic ectasia is well-managed with no change in size since 2017.  Current measurement is 2.8 cm, which is normal for his height. No family history of aortic dissection or rupture, and no additional risk factors present. - Re-evaluate abdominal aorta in 5-6 years - Monitor for any new symptoms  Minimally elevated hyperlipidemia, monitoring Minimally elevated hyperlipidemia is being monitored. Previous LDL was 113 mg/dL, improved from 869d with weight loss and exercise. No current lipid-lowering therapy due to lack of additional risk factors. - Order lipid panel and lipoprotein A (LPA) test today - Review results and discuss potential need for intervention if LPA is high   Follow up: 2 years  Signed,  Gordy Bergamo, MD, [facility] 04/16/2024, 8:52 PM [facility] [address] [place], [place] [address] Phone: [phone]. Fax:  [phone]

## 2024-04-16 NOTE — Patient Instructions (Signed)
 Medication Instructions:  .Your physician recommends that you continue on your current medications as directed. Please refer to the Current Medication list given to you today.  *If you need a refill on your cardiac medications before your next appointment, please call your pharmacy*  Lab Work: Have lab work done today in the lab on the first floor--Lipids and Lp(a) If you have labs (blood work) drawn today and your tests are completely normal, you will receive your results only by: MyChart Message (if you have MyChart) OR A paper copy in the mail If you have any lab test that is abnormal or we need to change your treatment, we will call you to review the results.  Testing/Procedures: none  Follow-Up: At [facility], you and your health needs are our priority.  As part of our continuing mission to provide you with exceptional heart care, our providers are all part of one team.  This team includes your primary Cardiologist (physician) and Advanced Practice Providers or APPs (Physician Assistants and Nurse Practitioners) who all work together to provide you with the care you need, when you need it.  Your next appointment:   2 year(s)  Provider:   Gordy Bergamo, MD    We recommend signing up for the patient portal called MyChart.  Sign up information is provided on this After Visit Summary.  MyChart is used to connect with patients for Virtual Visits (Telemedicine).  Patients are able to view lab/test results, encounter notes, upcoming appointments, etc.  Non-urgent messages can be sent to your provider as well.   To learn more about what you can do with MyChart, go to [email or web address].   Other Instructions

## 2024-04-17 ENCOUNTER — Ambulatory Visit: Payer: Self-pay | Admitting: Cardiology

## 2024-04-18 LAB — LIPOPROTEIN A (LPA): Lipoprotein (a): 96.4 nmol/L — ABNORMAL HIGH (ref <75.0)

## 2024-04-18 LAB — LIPID PANEL
Chol/HDL Ratio: 2.9 ratio (ref 0.0–5.0)
Cholesterol, Total: 208 mg/dL — ABNORMAL HIGH (ref 100–199)
HDL: 71 mg/dL (ref >39)
LDL Chol Calc (NIH): 126 mg/dL — ABNORMAL HIGH (ref 0–99)
Triglycerides: 64 mg/dL (ref 0–149)
VLDL Cholesterol Cal: 11 mg/dL (ref 5–40)
